# Patient Record
Sex: Female | Born: 1955 | Race: Black or African American | Hispanic: No | Marital: Married | Smoking: Current every day smoker
Health system: Southern US, Community
[De-identification: ages and names within clinical notes are randomized; demographics above are authoritative.]

## PROBLEM LIST (undated history)

## (undated) DIAGNOSIS — I729 Aneurysm of unspecified site: Secondary | ICD-10-CM

## (undated) DIAGNOSIS — I10 Essential (primary) hypertension: Secondary | ICD-10-CM

## (undated) DIAGNOSIS — R569 Unspecified convulsions: Secondary | ICD-10-CM

## (undated) DIAGNOSIS — I2510 Atherosclerotic heart disease of native coronary artery without angina pectoris: Secondary | ICD-10-CM

## (undated) DIAGNOSIS — J449 Chronic obstructive pulmonary disease, unspecified: Secondary | ICD-10-CM

## (undated) DIAGNOSIS — K5790 Diverticulosis of intestine, part unspecified, without perforation or abscess without bleeding: Secondary | ICD-10-CM

## (undated) DIAGNOSIS — E119 Type 2 diabetes mellitus without complications: Secondary | ICD-10-CM

## (undated) HISTORY — PX: LUMBAR FUSION: SHX111

## (undated) HISTORY — PX: OTHER SURGICAL HISTORY: SHX169

## (undated) HISTORY — DX: Type 2 diabetes mellitus without complications: E11.9

## (undated) HISTORY — PX: TUMOR EXCISION: SHX421

## (undated) HISTORY — DX: Unspecified convulsions: R56.9

## (undated) HISTORY — DX: Diverticulosis of intestine, part unspecified, without perforation or abscess without bleeding: K57.90

## (undated) HISTORY — DX: Aneurysm of unspecified site: I72.9

---

## 2016-12-02 ENCOUNTER — Emergency Department (HOSPITAL_BASED_OUTPATIENT_CLINIC_OR_DEPARTMENT_OTHER)
Admission: EM | Admit: 2016-12-02 | Discharge: 2016-12-02 | Disposition: A | Discharge status: Home / Self Care | Payer: Self-pay | Attending: Emergency Medicine | Admitting: Emergency Medicine

## 2016-12-02 ENCOUNTER — Emergency Department (HOSPITAL_BASED_OUTPATIENT_CLINIC_OR_DEPARTMENT_OTHER): Payer: Self-pay

## 2016-12-02 ENCOUNTER — Encounter (HOSPITAL_BASED_OUTPATIENT_CLINIC_OR_DEPARTMENT_OTHER): Payer: Self-pay

## 2016-12-02 DIAGNOSIS — J449 Chronic obstructive pulmonary disease, unspecified: Secondary | ICD-10-CM | POA: Insufficient documentation

## 2016-12-02 DIAGNOSIS — I2510 Atherosclerotic heart disease of native coronary artery without angina pectoris: Secondary | ICD-10-CM | POA: Insufficient documentation

## 2016-12-02 DIAGNOSIS — R05 Cough: Secondary | ICD-10-CM | POA: Insufficient documentation

## 2016-12-02 DIAGNOSIS — R0981 Nasal congestion: Secondary | ICD-10-CM | POA: Insufficient documentation

## 2016-12-02 DIAGNOSIS — J40 Bronchitis, not specified as acute or chronic: Secondary | ICD-10-CM | POA: Insufficient documentation

## 2016-12-02 HISTORY — DX: Chronic obstructive pulmonary disease, unspecified: J44.9

## 2016-12-02 LAB — CBC WITH DIFFERENTIAL/PLATELET
BASOS PCT: 0 %
Basophils Absolute: 0 10*3/uL (ref 0.0–0.1)
EOS ABS: 0.3 10*3/uL (ref 0.0–0.7)
Eosinophils Relative: 2 %
HCT: 44.3 % (ref 36.0–46.0)
HEMOGLOBIN: 15.3 g/dL — AB (ref 12.0–15.0)
Lymphocytes Relative: 22 %
Lymphs Abs: 3.1 10*3/uL (ref 0.7–4.0)
MCH: 29.9 pg (ref 26.0–34.0)
MCHC: 34.5 g/dL (ref 30.0–36.0)
MCV: 86.7 fL (ref 78.0–100.0)
Monocytes Absolute: 0.6 10*3/uL (ref 0.1–1.0)
Monocytes Relative: 4 %
NEUTROS PCT: 72 %
Neutro Abs: 10.2 10*3/uL — ABNORMAL HIGH (ref 1.7–7.7)
PLATELETS: 196 10*3/uL (ref 150–400)
RBC: 5.11 MIL/uL (ref 3.87–5.11)
RDW: 16.1 % — ABNORMAL HIGH (ref 11.5–15.5)
WBC: 14.2 10*3/uL — ABNORMAL HIGH (ref 4.0–10.5)

## 2016-12-02 LAB — COMPREHENSIVE METABOLIC PANEL
ALK PHOS: 112 U/L (ref 38–126)
ALT: 13 U/L — AB (ref 14–54)
AST: 19 U/L (ref 15–41)
Albumin: 4.2 g/dL (ref 3.5–5.0)
Anion gap: 7 (ref 5–15)
BILIRUBIN TOTAL: 0.8 mg/dL (ref 0.3–1.2)
BUN: 9 mg/dL (ref 6–20)
CO2: 23 mmol/L (ref 22–32)
Calcium: 9.3 mg/dL (ref 8.9–10.3)
Chloride: 107 mmol/L (ref 101–111)
Creatinine, Ser: 0.89 mg/dL (ref 0.44–1.00)
GLUCOSE: 100 mg/dL — AB (ref 65–99)
Potassium: 3.8 mmol/L (ref 3.5–5.1)
Sodium: 137 mmol/L (ref 135–145)
Total Protein: 8 g/dL (ref 6.5–8.1)

## 2016-12-02 LAB — BRAIN NATRIURETIC PEPTIDE: B Natriuretic Peptide: 54.6 pg/mL (ref 0.0–100.0)

## 2016-12-02 LAB — TROPONIN I

## 2016-12-02 IMAGING — CR DG CHEST 2V
2 series · 2 of 2 positions shown · non-contrast
Comparison: None.

CLINICAL DATA: Cough with shortness of breath and wheezing. Chest
pain

EXAM:
CHEST  2 VIEW

[w chest pa]
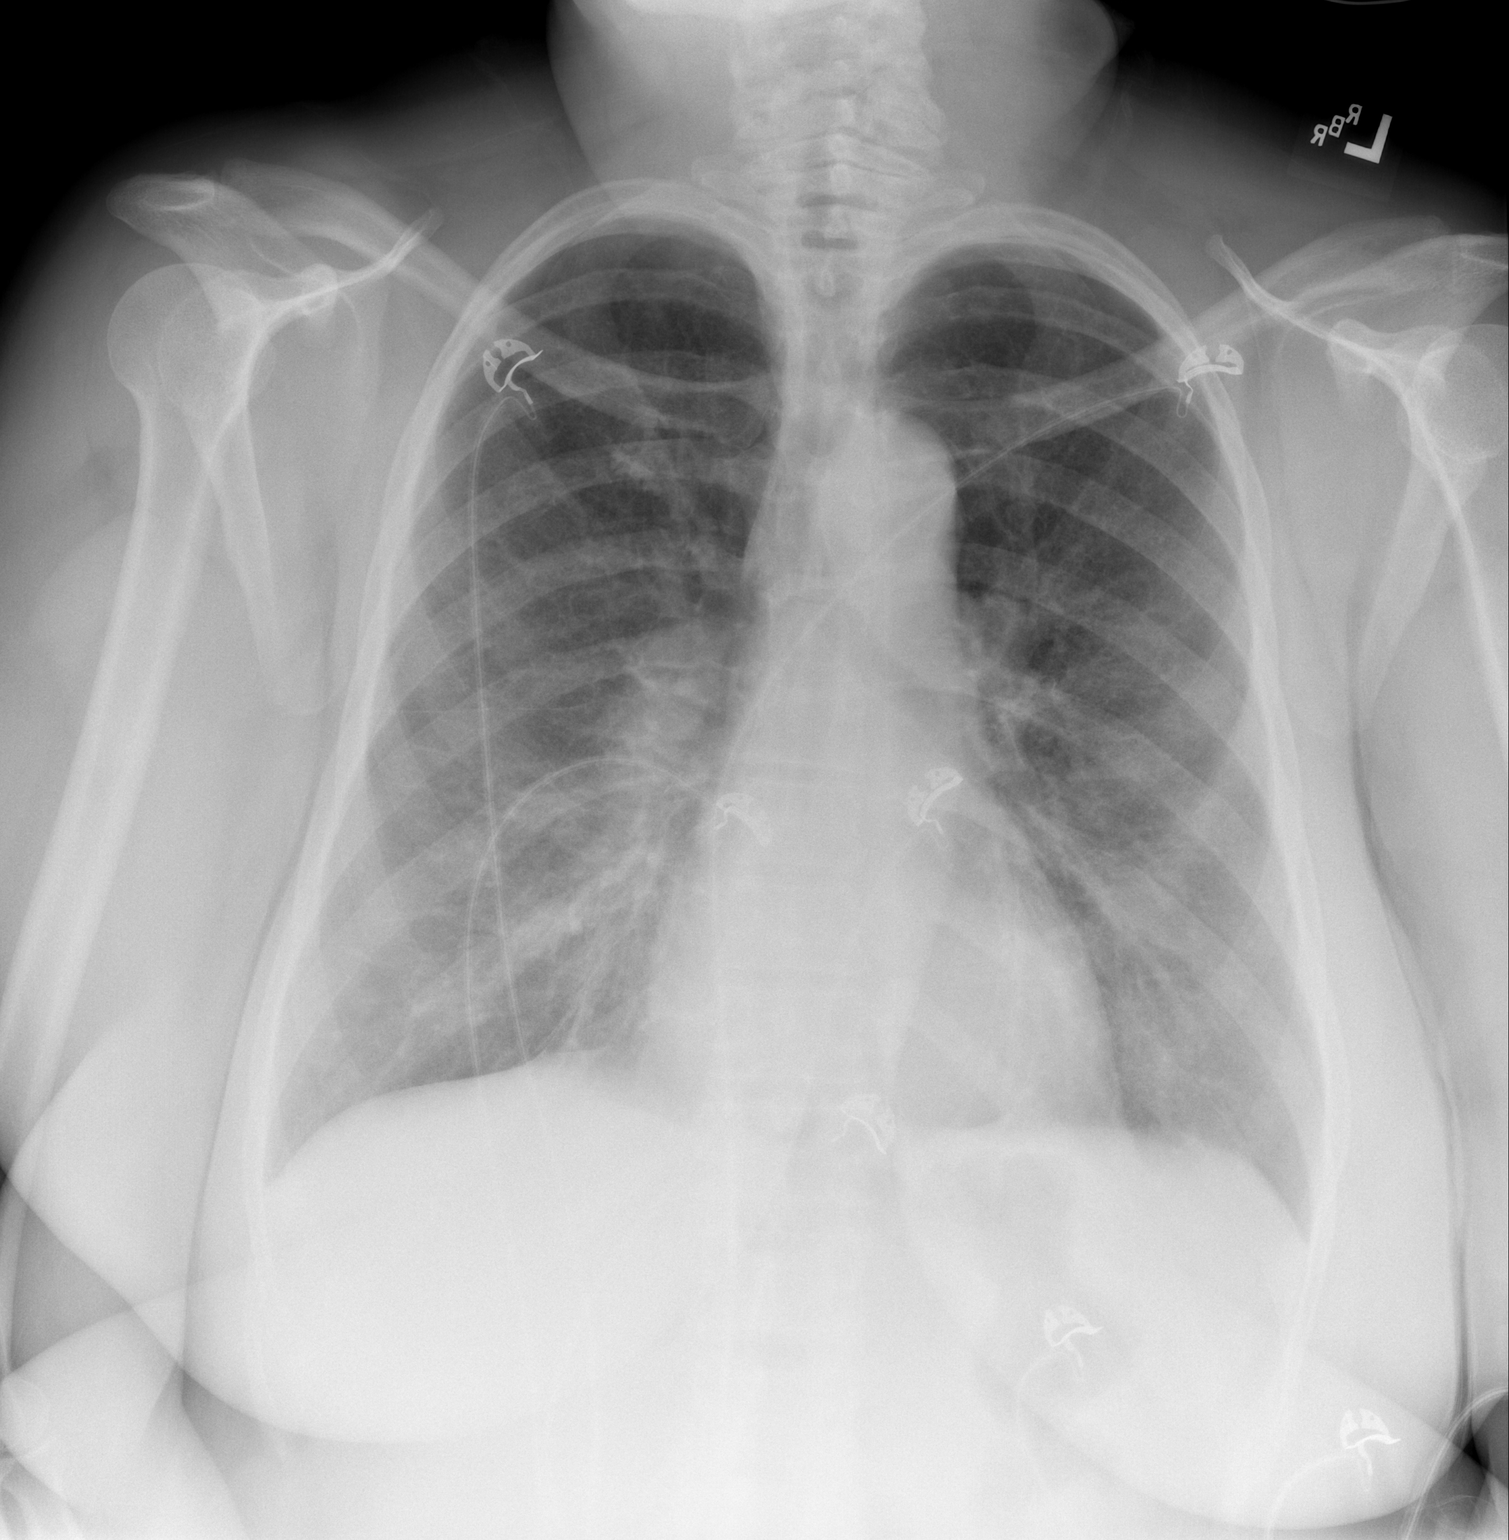

[w chest lat]
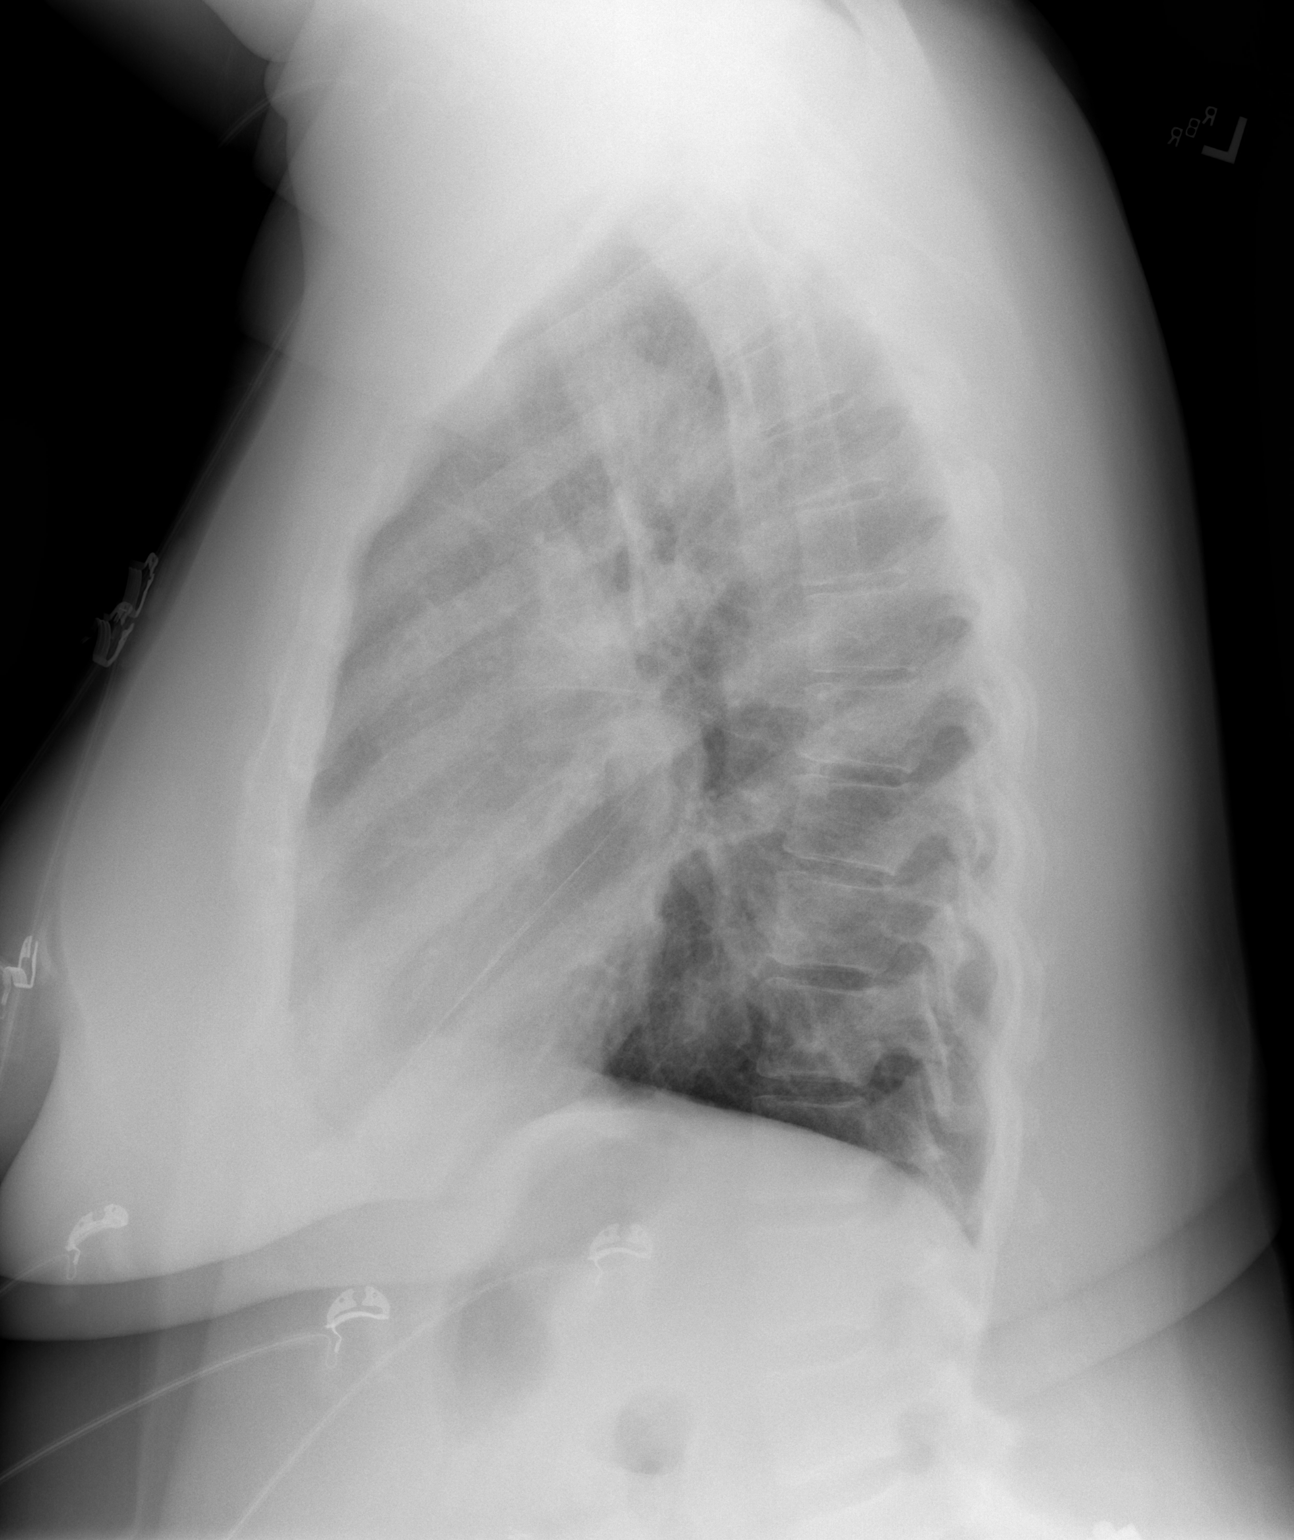

[2 of 2 positions shown; findings below may reference images not displayed]

FINDINGS: Interstitial coarsening that could be bronchitic. No Kerley B lines,
effusion, or pneumothorax. Normal heart size and aortic contours.
IMPRESSION: Possible bronchitic markings.  Negative for pneumonia or edema.

## 2016-12-02 MED ORDER — SODIUM CHLORIDE 0.9 % IV BOLUS (SEPSIS)
1000.0000 mL | Freq: Once | INTRAVENOUS | Status: AC
  Administered 2016-12-02: 1000 mL via INTRAVENOUS

## 2016-12-02 MED ORDER — METHYLPREDNISOLONE SODIUM SUCC 125 MG IJ SOLR
125.0000 mg | Freq: Once | INTRAMUSCULAR | Status: AC
  Administered 2016-12-02: 125 mg via INTRAVENOUS
  Filled 2016-12-02: qty 2

## 2016-12-02 MED ORDER — ALBUTEROL (5 MG/ML) CONTINUOUS INHALATION SOLN
10.0000 mg/h | INHALATION_SOLUTION | Freq: Once | RESPIRATORY_TRACT | Status: AC
  Administered 2016-12-02: 10 mg/h via RESPIRATORY_TRACT
  Filled 2016-12-02: qty 20

## 2016-12-02 MED ORDER — ALBUTEROL SULFATE (2.5 MG/3ML) 0.083% IN NEBU
2.5000 mg | INHALATION_SOLUTION | Freq: Once | RESPIRATORY_TRACT | Status: AC
  Administered 2016-12-02: 2.5 mg via RESPIRATORY_TRACT
  Filled 2016-12-02: qty 3

## 2016-12-02 MED ORDER — TIOTROPIUM BROMIDE MONOHYDRATE 18 MCG IN CAPS: 18.0000 ug | ORAL_CAPSULE | Freq: Every day | RESPIRATORY_TRACT | 0 refills | Status: DC

## 2016-12-02 MED ORDER — PREDNISONE 50 MG PO TABS: 50.0000 mg | ORAL_TABLET | Freq: Every day | ORAL | 0 refills | Status: DC

## 2016-12-02 MED ORDER — IPRATROPIUM-ALBUTEROL 0.5-2.5 (3) MG/3ML IN SOLN
3.0000 mL | Freq: Once | RESPIRATORY_TRACT | Status: AC
  Administered 2016-12-02: 3 mL via RESPIRATORY_TRACT
  Filled 2016-12-02: qty 3

## 2016-12-02 MED ORDER — ALBUTEROL SULFATE HFA 108 (90 BASE) MCG/ACT IN AERS: 2.0000 | INHALATION_SPRAY | RESPIRATORY_TRACT | 0 refills | Status: AC | PRN

## 2016-12-02 NOTE — ED Notes (Signed)
Ambulated in hall on room air HR 101-108, SpO2 91-95% RR 22.  Patient has walker at home but not using. BBS with scatter rhonchi.

## 2016-12-02 NOTE — ED Notes (Signed)
Patient ambulated to bathroom, spot check pox 88-90%, RR 24, HR 122 after 1 hour CAT neb. RN aware.

## 2016-12-02 NOTE — ED Triage Notes (Signed)
Pt reports SHOB since Saturday; has hx of COPD. Pt in tripod position and coughing. Pt's initial pulse ox 94-95% on room air. Respiratory at bedside.

## 2016-12-02 NOTE — Discharge Instructions (Signed)
Return here as needed. Follow up with a primary doctor. °

## 2016-12-02 NOTE — ED Provider Notes (Signed)
[facility]   CSN: [id number] Arrival date & time: 12/02/16  1402     History   Chief Complaint Chief Complaint  Patient presents with  . Shortness of Breath    HPI SELAH KLANG is a 61 y.o. female.  HPI ADAJAH COCKING is a 61 y.o. female with history of COPD, coronary artery disease, presents to emergency department complaining of cough, congestion, shortness of breath. Patient states her symptoms began 3 days ago after she ate some vegetables that she believes were not washed. She states "pesticide from all these vegetables cause me to have an asthma attack." Patient states she also planted some flowers same day and states that the dirt exacerbated her symptoms. She states that she has been feeling worse and worse every day since then. She reports productive cough, shortness of breath, chest tightness with coughing. She reports subjective fever, chills, nasal congestion. She states that she took Tylenol for her fever and chills at home, last dose this morning. Patient states that she is currently out of her medications which includes her blood pressure medications, her blood thinners, and her COPD medications and inhalers. She states she has not been to the doctor in about a year. She states she has not had her medicines for about 6 months. She states that any exertion making her symptoms worse, nothing makes it better.  Past Medical History:  Diagnosis Date  . COPD (chronic obstructive pulmonary disease) (HCC)     There are no active problems to display for this patient.   No past surgical history on file.  OB History    No data available       Home Medications    Prior to Admission medications   Not on File    Family History No family history on file.  Social History Social History  Substance Use Topics  . Smoking status: Not on file  . Smokeless tobacco: Not on file  . Alcohol use Not on file     Allergies   Aspirin;  Penicillins; and Sulfa antibiotics   Review of Systems Review of Systems  Constitutional: Positive for chills and fever.  HENT: Positive for congestion.   Respiratory: Positive for cough, chest tightness and shortness of breath.   Cardiovascular: Positive for chest pain and leg swelling. Negative for palpitations.  Gastrointestinal: Negative for abdominal pain, diarrhea, nausea and vomiting.  Genitourinary: Negative for dysuria, flank pain, pelvic pain, vaginal bleeding, vaginal discharge and vaginal pain.  Musculoskeletal: Negative for arthralgias, myalgias, neck pain and neck stiffness.  Skin: Negative for rash.  Neurological: Negative for dizziness, weakness and headaches.  All other systems reviewed and are negative.    Physical Exam Updated Vital Signs BP (!) 211/118 (BP Location: Left Arm)   Pulse 91   Temp 98.2 F (36.8 C) (Oral)   Resp (!) 28   SpO2 96%   Physical Exam  Constitutional: She appears well-developed and well-nourished. No distress.  HENT:  Head: Normocephalic.  Eyes: Conjunctivae are normal.  Neck: Neck supple.  Cardiovascular: Normal rate, regular rhythm and normal heart sounds.   Pulmonary/Chest: Effort normal. No respiratory distress. She has wheezes. She has rales.  Bilateral wheezes and rails  Abdominal: Soft. Bowel sounds are normal. She exhibits no distension. There is no tenderness. There is no rebound.  Musculoskeletal: She exhibits no edema.  Neurological: She is alert.  Skin: Skin is warm and dry.  Psychiatric: She has a normal mood and affect. Her behavior  is normal.  Nursing note and vitals reviewed.    ED Treatments / Results  Labs (all labs ordered are listed, but only abnormal results are displayed) Labs Reviewed  CBC WITH DIFFERENTIAL/PLATELET  COMPREHENSIVE METABOLIC PANEL  TROPONIN I  BRAIN NATRIURETIC PEPTIDE    EKG  EKG Interpretation None       Radiology Dg Chest 2 View  Result Date: 12/02/2016 CLINICAL DATA:   Cough with shortness of breath and wheezing. Chest pain EXAM: CHEST  2 VIEW COMPARISON:  None. FINDINGS: Interstitial coarsening that could be bronchitic. No Kerley B lines, effusion, or pneumothorax. Normal heart size and aortic contours. IMPRESSION: Possible bronchitic markings.  Negative for pneumonia or edema. Electronically Signed   By: Marnee Spring M.D.   On: 12/02/2016 15:12    Procedures Procedures (including critical care time)  Medications Ordered in ED Medications  ipratropium-albuterol (DUONEB) 0.5-2.5 (3) MG/3ML nebulizer solution 3 mL (not administered)  albuterol (PROVENTIL) (2.5 MG/3ML) 0.083% nebulizer solution 2.5 mg (not administered)  methylPREDNISolone sodium succinate (SOLU-MEDROL) 125 mg/2 mL injection 125 mg (not administered)     Initial Impression / Assessment and Plan / ED Course  I have reviewed the triage vital signs and the nursing notes.  Pertinent labs & imaging results that were available during my care of the patient were reviewed by me and considered in my medical decision making (see chart for details).     Patient seen and examined. Patient with COPD exacerbation, cough, shortness of breath. She does have wheezing and rails on exam. She has been off of her blood pressure and COPD medications for greater than 6 months. Her blood pressure is elevated, 211/118. She states it is "because I am in pain." We will give her breathing treatments, steroids, check labs, EKG, chest x-ray. Will monitor closely.  3:19 PM CXR consistent with bronchitic changes. No pneumonia. No improvement with duoneb, still wheezing. Will order hour long neb  Singed out pending blood work and recheck after hour long neb. If improving and does not desat with ambulation, OK for dc home. Signed out to [employer].     Final Clinical Impressions(s) / ED Diagnoses   Final diagnoses:  None    New Prescriptions New Prescriptions   No medications on file     Jaynie Crumble, Cordelia Poche 12/03/16 1539    Benjiman Core, MD 12/03/16 1640

## 2019-11-30 ENCOUNTER — Emergency Department (HOSPITAL_COMMUNITY): Payer: PRIVATE HEALTH INSURANCE

## 2019-11-30 ENCOUNTER — Encounter (HOSPITAL_COMMUNITY): Payer: Self-pay | Admitting: Internal Medicine

## 2019-11-30 ENCOUNTER — Inpatient Hospital Stay (HOSPITAL_COMMUNITY)
Admission: EM | Admit: 2019-11-30 | Discharge: 2019-12-03 | DRG: 097 | Disposition: A | Discharge status: Home Health | Payer: PRIVATE HEALTH INSURANCE | Attending: Family Medicine | Admitting: Family Medicine

## 2019-11-30 DIAGNOSIS — I10 Essential (primary) hypertension: Secondary | ICD-10-CM | POA: Diagnosis present

## 2019-11-30 DIAGNOSIS — E1165 Type 2 diabetes mellitus with hyperglycemia: Secondary | ICD-10-CM | POA: Diagnosis present

## 2019-11-30 DIAGNOSIS — J449 Chronic obstructive pulmonary disease, unspecified: Secondary | ICD-10-CM | POA: Diagnosis present

## 2019-11-30 DIAGNOSIS — A419 Sepsis, unspecified organism: Secondary | ICD-10-CM | POA: Diagnosis present

## 2019-11-30 DIAGNOSIS — Z7902 Long term (current) use of antithrombotics/antiplatelets: Secondary | ICD-10-CM

## 2019-11-30 DIAGNOSIS — I2510 Atherosclerotic heart disease of native coronary artery without angina pectoris: Secondary | ICD-10-CM | POA: Diagnosis present

## 2019-11-30 DIAGNOSIS — R4182 Altered mental status, unspecified: Secondary | ICD-10-CM | POA: Diagnosis present

## 2019-11-30 DIAGNOSIS — G9341 Metabolic encephalopathy: Secondary | ICD-10-CM | POA: Diagnosis present

## 2019-11-30 DIAGNOSIS — Z955 Presence of coronary angioplasty implant and graft: Secondary | ICD-10-CM | POA: Diagnosis not present

## 2019-11-30 DIAGNOSIS — F17210 Nicotine dependence, cigarettes, uncomplicated: Secondary | ICD-10-CM | POA: Diagnosis present

## 2019-11-30 DIAGNOSIS — G8191 Hemiplegia, unspecified affecting right dominant side: Secondary | ICD-10-CM | POA: Diagnosis present

## 2019-11-30 DIAGNOSIS — Z20822 Contact with and (suspected) exposure to covid-19: Secondary | ICD-10-CM | POA: Diagnosis present

## 2019-11-30 DIAGNOSIS — R569 Unspecified convulsions: Secondary | ICD-10-CM | POA: Diagnosis present

## 2019-11-30 DIAGNOSIS — G40201 Localization-related (focal) (partial) symptomatic epilepsy and epileptic syndromes with complex partial seizures, not intractable, with status epilepticus: Secondary | ICD-10-CM

## 2019-11-30 DIAGNOSIS — A86 Unspecified viral encephalitis: Secondary | ICD-10-CM | POA: Diagnosis present

## 2019-11-30 DIAGNOSIS — E785 Hyperlipidemia, unspecified: Secondary | ICD-10-CM | POA: Diagnosis present

## 2019-11-30 DIAGNOSIS — G9340 Encephalopathy, unspecified: Secondary | ICD-10-CM

## 2019-11-30 DIAGNOSIS — R6520 Severe sepsis without septic shock: Secondary | ICD-10-CM

## 2019-11-30 HISTORY — DX: Essential (primary) hypertension: I10

## 2019-11-30 HISTORY — DX: Atherosclerotic heart disease of native coronary artery without angina pectoris: I25.10

## 2019-11-30 LAB — CBC
HCT: 49.5 % — ABNORMAL HIGH (ref 36.0–46.0)
Hemoglobin: 16.4 g/dL — ABNORMAL HIGH (ref 12.0–15.0)
MCH: 29.3 pg (ref 26.0–34.0)
MCHC: 33.1 g/dL (ref 30.0–36.0)
MCV: 88.4 fL (ref 80.0–100.0)
Platelets: 328 10*3/uL (ref 150–400)
RBC: 5.6 MIL/uL — ABNORMAL HIGH (ref 3.87–5.11)
RDW: 14.2 % (ref 11.5–15.5)
WBC: 16.6 10*3/uL — ABNORMAL HIGH (ref 4.0–10.5)
nRBC: 0 % (ref 0.0–0.2)

## 2019-11-30 LAB — CSF CELL COUNT WITH DIFFERENTIAL
Eosinophils, CSF: 0 % (ref 0–1)
Eosinophils, CSF: 0 % (ref 0–1)
Lymphs, CSF: 0 % — ABNORMAL LOW (ref 40–80)
Lymphs, CSF: 1 % — ABNORMAL LOW (ref 40–80)
Monocyte-Macrophage-Spinal Fluid: 11 % — ABNORMAL LOW (ref 15–45)
Monocyte-Macrophage-Spinal Fluid: 6 % — ABNORMAL LOW (ref 15–45)
RBC Count, CSF: 155 /mm3 — ABNORMAL HIGH
RBC Count, CSF: 6 /mm3 — ABNORMAL HIGH
Segmented Neutrophils-CSF: 89 % — ABNORMAL HIGH (ref 0–6)
Segmented Neutrophils-CSF: 93 % — ABNORMAL HIGH (ref 0–6)
Tube #: 1
Tube #: 4
WBC, CSF: 24 /mm3 (ref 0–5)
WBC, CSF: 26 /mm3 (ref 0–5)

## 2019-11-30 LAB — I-STAT CHEM 8, ED
BUN: 12 mg/dL (ref 8–23)
Calcium, Ion: 1.13 mmol/L — ABNORMAL LOW (ref 1.15–1.40)
Chloride: 108 mmol/L (ref 98–111)
Creatinine, Ser: 0.6 mg/dL (ref 0.44–1.00)
Glucose, Bld: 340 mg/dL — ABNORMAL HIGH (ref 70–99)
HCT: 50 % — ABNORMAL HIGH (ref 36.0–46.0)
Hemoglobin: 17 g/dL — ABNORMAL HIGH (ref 12.0–15.0)
Potassium: 3.6 mmol/L (ref 3.5–5.1)
Sodium: 140 mmol/L (ref 135–145)
TCO2: 17 mmol/L — ABNORMAL LOW (ref 22–32)

## 2019-11-30 LAB — COMPREHENSIVE METABOLIC PANEL
ALT: 19 U/L (ref 0–44)
AST: 18 U/L (ref 15–41)
Albumin: 3.7 g/dL (ref 3.5–5.0)
Alkaline Phosphatase: 87 U/L (ref 38–126)
Anion gap: 17 — ABNORMAL HIGH (ref 5–15)
BUN: 12 mg/dL (ref 8–23)
CO2: 18 mmol/L — ABNORMAL LOW (ref 22–32)
Calcium: 9.9 mg/dL (ref 8.9–10.3)
Chloride: 104 mmol/L (ref 98–111)
Creatinine, Ser: 1.01 mg/dL — ABNORMAL HIGH (ref 0.44–1.00)
GFR calc Af Amer: >60 mL/min (ref >60)
GFR calc non Af Amer: 59 mL/min — ABNORMAL LOW (ref >60)
Glucose, Bld: 337 mg/dL — ABNORMAL HIGH (ref 70–99)
Potassium: 3.7 mmol/L (ref 3.5–5.1)
Sodium: 139 mmol/L (ref 135–145)
Total Bilirubin: 1.2 mg/dL (ref 0.3–1.2)
Total Protein: 7.5 g/dL (ref 6.5–8.1)

## 2019-11-30 LAB — PROTIME-INR
INR: 1.1 (ref 0.8–1.2)
Prothrombin Time: 13.7 seconds (ref 11.4–15.2)

## 2019-11-30 LAB — LACTIC ACID, PLASMA
Lactic Acid, Venous: 2.3 mmol/L (ref 0.5–1.9)
Lactic Acid, Venous: 1.9 mmol/L (ref 0.5–1.9)

## 2019-11-30 LAB — GLUCOSE, CSF: Glucose, CSF: 202 mg/dL — ABNORMAL HIGH (ref 40–70)

## 2019-11-30 LAB — RESPIRATORY PANEL BY RT PCR (FLU A&B, COVID)
Influenza A by PCR: NEGATIVE
Influenza B by PCR: NEGATIVE
SARS Coronavirus 2 by RT PCR: NEGATIVE

## 2019-11-30 LAB — DIFFERENTIAL
Abs Immature Granulocytes: 0.1 10*3/uL — ABNORMAL HIGH (ref 0.00–0.07)
Basophils Absolute: 0.1 10*3/uL (ref 0.0–0.1)
Basophils Relative: 0 %
Eosinophils Absolute: 0 10*3/uL (ref 0.0–0.5)
Eosinophils Relative: 0 %
Immature Granulocytes: 1 %
Lymphocytes Relative: 15 %
Lymphs Abs: 2.5 10*3/uL (ref 0.7–4.0)
Monocytes Absolute: 1.2 10*3/uL — ABNORMAL HIGH (ref 0.1–1.0)
Monocytes Relative: 7 %
Neutro Abs: 12.8 10*3/uL — ABNORMAL HIGH (ref 1.7–7.7)
Neutrophils Relative %: 77 %

## 2019-11-30 LAB — APTT: aPTT: 25 seconds (ref 24–36)

## 2019-11-30 LAB — PROTEIN, CSF: Total  Protein, CSF: 41 mg/dL (ref 15–45)

## 2019-11-30 LAB — CBG MONITORING, ED: Glucose-Capillary: 373 mg/dL — ABNORMAL HIGH (ref 70–99)

## 2019-11-30 LAB — PROCALCITONIN: Procalcitonin: <0.1 ng/mL

## 2019-11-30 IMAGING — CT CT HEAD CODE STROKE
4 series · 17 of 47 positions shown, 19 images · non-contrast
Comparison: None.

CLINICAL DATA: Code stroke.  Acute neuro deficit.  Left-sided gaze.

EXAM:
CT HEAD WITHOUT CONTRAST
TECHNIQUE: Contiguous axial images were obtained from the base of the skull
through the vertex without intravenous contrast.

[Series 3: head wo · axial · 0.44mm/px · z∈[+1328,+1452]mm · 7 of 35 slices shown, 9 images]
[im 5/35  brain]
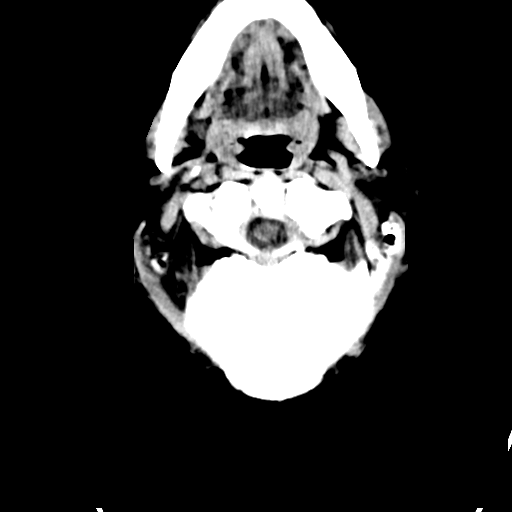
[im 5/35  bone]
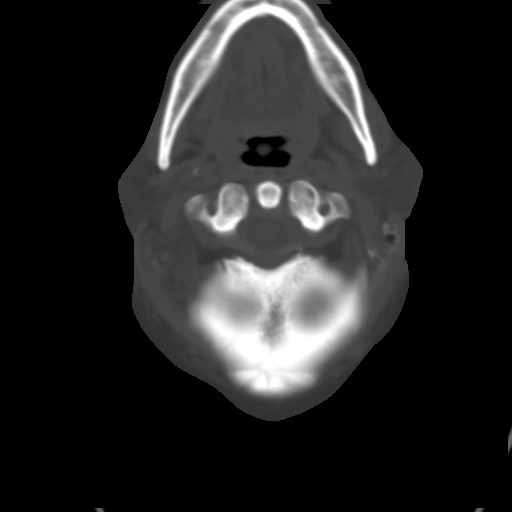
[im 9/35  brain]
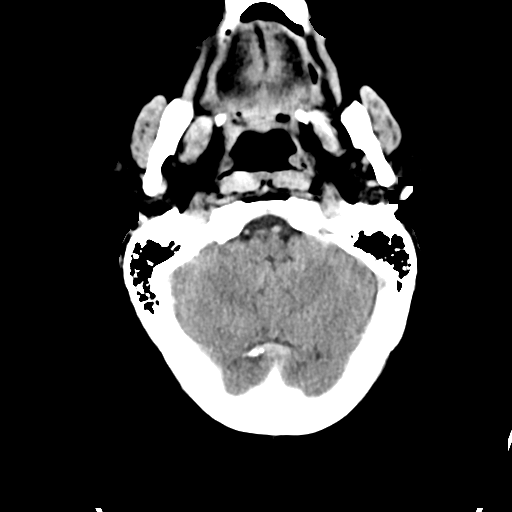
[im 13/35  brain]
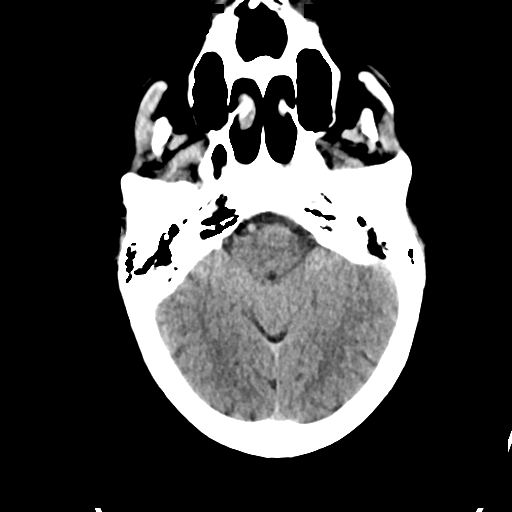
[im 18/35  brain]
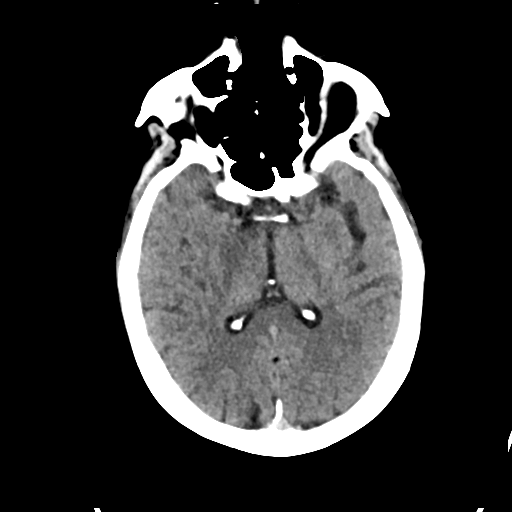
[im 22/35  brain]
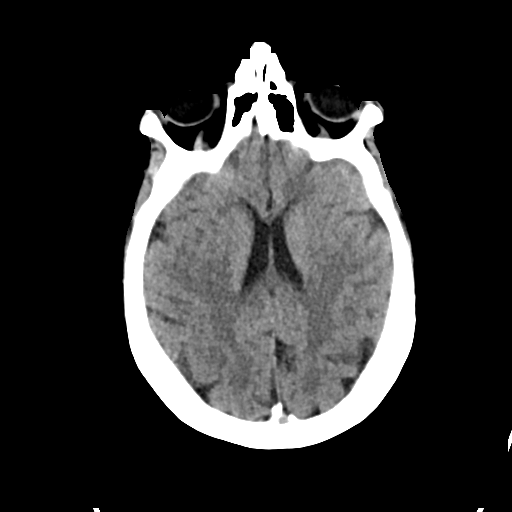
[im 22/35  bone]
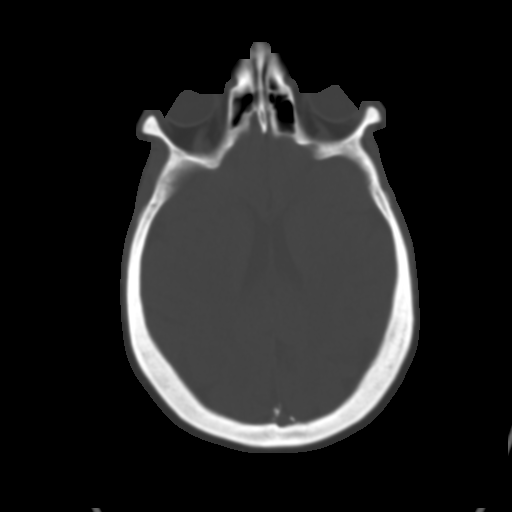
[im 26/35  brain]
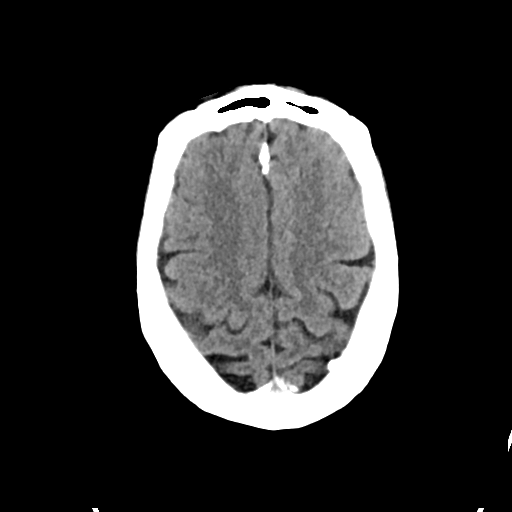
[im 30/35  brain]
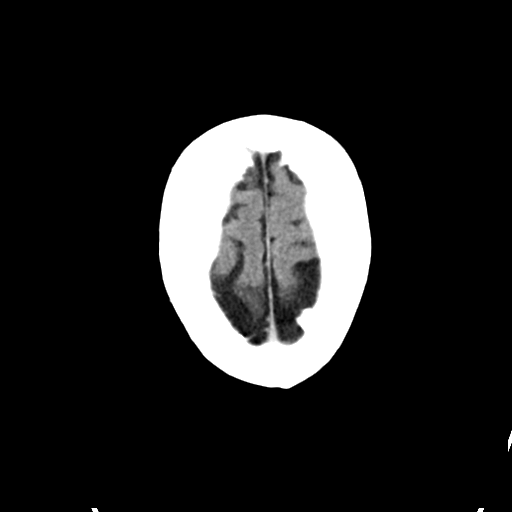

[Series 4: head bone · axial · 0.44mm/px · z∈[+1324,+1384]mm · 4 of 87 slices shown]
[im 9/87  bone]
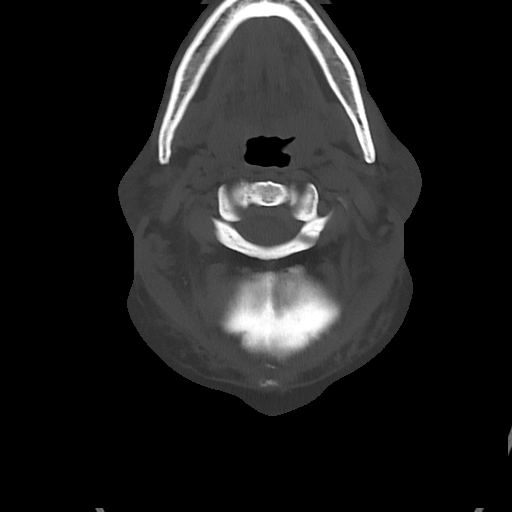
[im 18/87  bone]
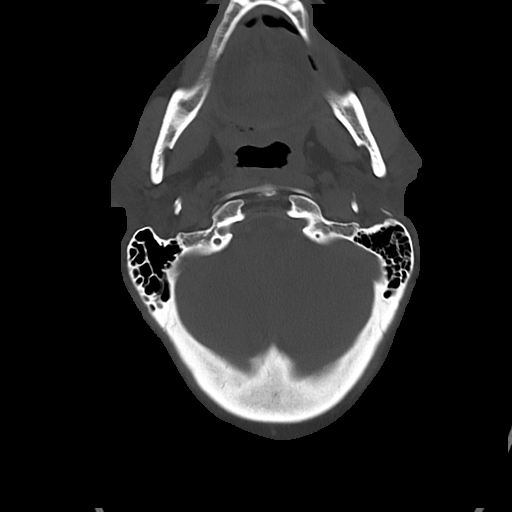
[im 26/87  bone]
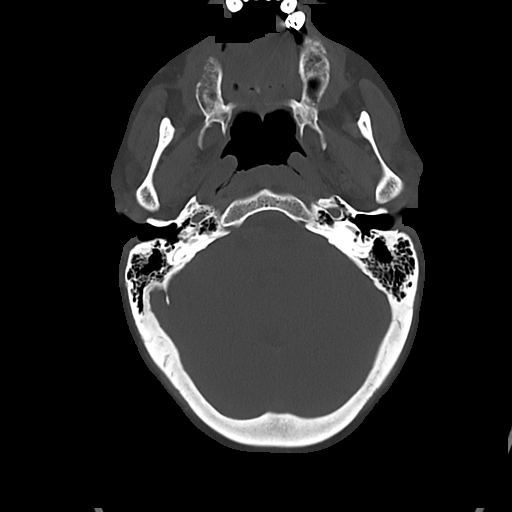
[im 39/87  bone]
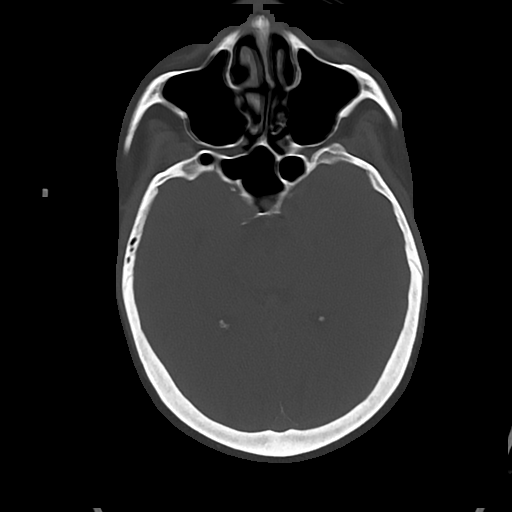

[Series 5: cor soft · coronal · 0.34mm/px · 3 of 69 slices shown]
[im 27/69  brain]
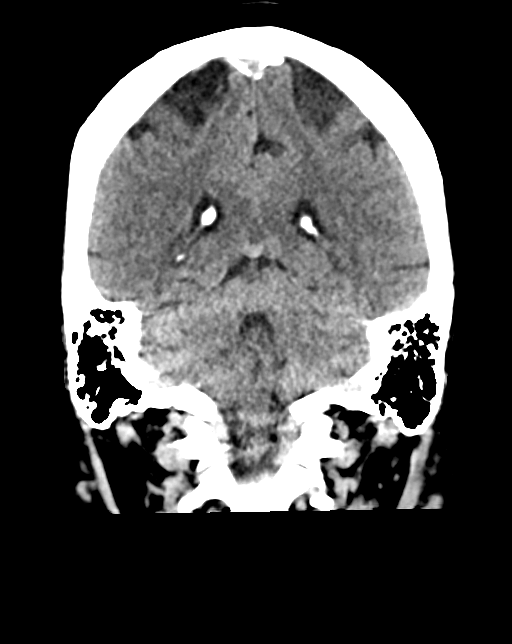
[im 32/69  brain]
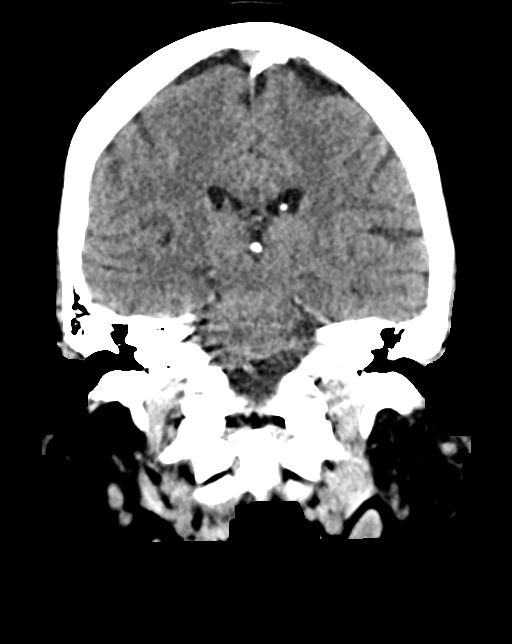
[im 37/69  brain]
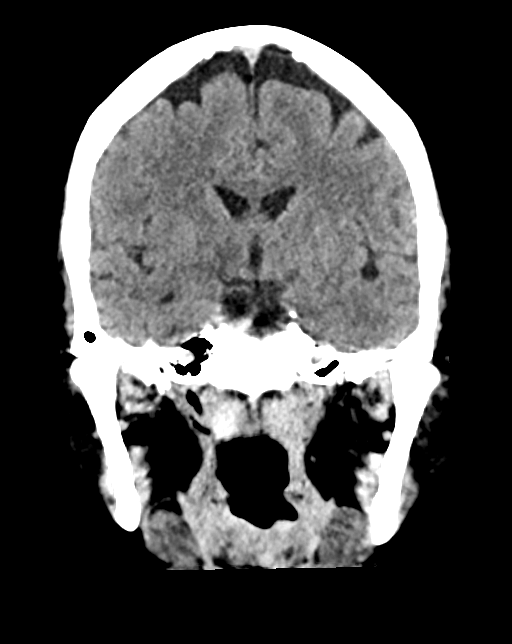

[Series 6: sag soft · sagittal · 0.41mm/px · 3 of 58 slices shown]
[im 20/58  brain]
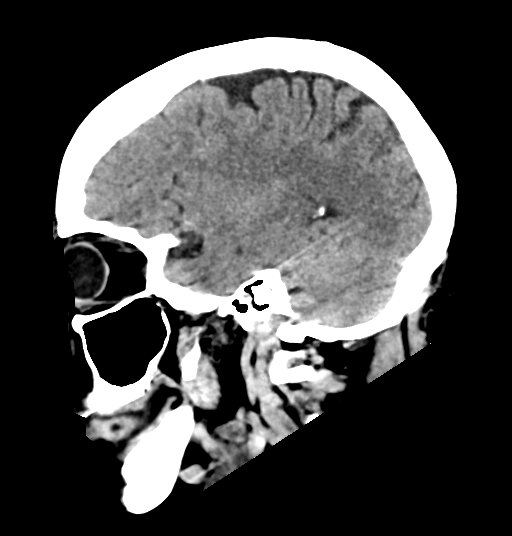
[im 29/58  brain]
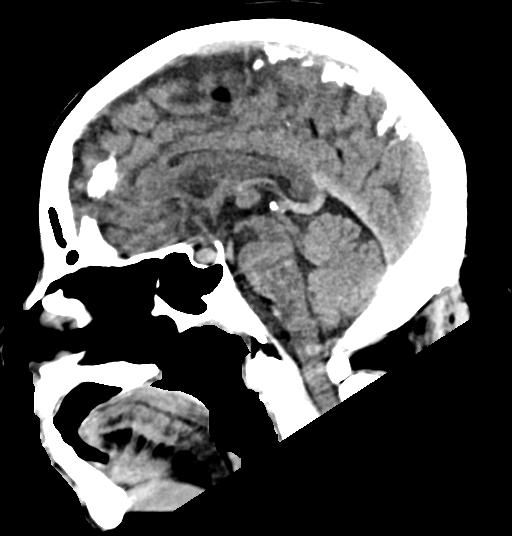
[im 39/58  brain]
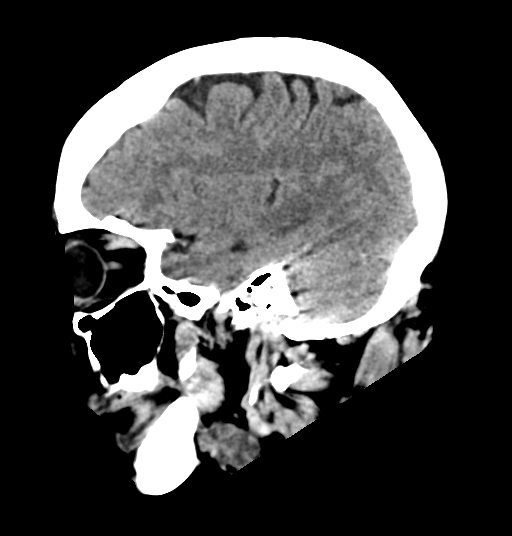

[17 of 47 positions shown; findings below may reference images not displayed]

FINDINGS: Brain: No evidence of acute infarction, hemorrhage, hydrocephalus,
extra-axial collection or mass lesion/mass effect.

Motion degraded study.

Vascular: Negative for hyperdense vessel.

Skull: Negative

Sinuses/Orbits: Paranasal sinuses clear.  Negative orbit.

Other: None

ASPECTS (Alberta Stroke Program Early CT Score)

- Ganglionic level infarction (caudate, lentiform nuclei, internal
capsule, insula, M1-M3 cortex): 7

- Supraganglionic infarction (M4-M6 cortex): 3

Total score (0-10 with 10 being normal): 10
IMPRESSION: 1. Negative CT head
2. ASPECTS is 10
3. Code stroke imaging results were communicated on [DATE] at
[DATE] to provider WILSON DE JESUS via secure text paging.

## 2019-11-30 IMAGING — RF DG FLUORO GUIDE SPINAL/SI JT INJ*L*
2 series · 2 of 2 positions shown · non-contrast
Comparison: none

CLINICAL DATA: 64-year-old female code stroke presentation, new
onset seizure, unexplained altered mental status. Found to be
febrile unsuccessful bedside lumbar puncture in the emergency
department.

[Series 1: fluoro_myelogram_singleshot_bb · 0.20mm/px · 1 of 1 slices shown]
[im 1/1]
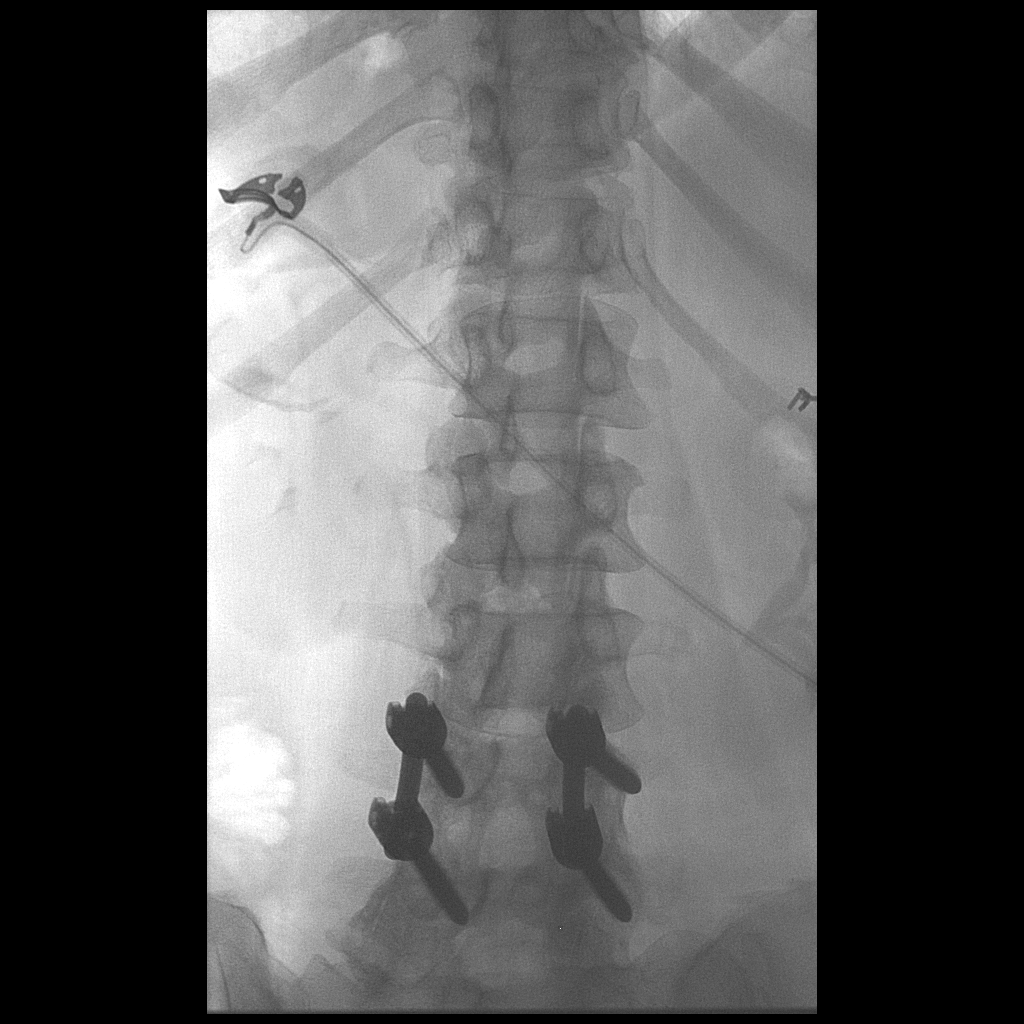

[Series 2: cp_standard · 0.17mm/px · 1 of 1 slices shown]
[im 1/1]
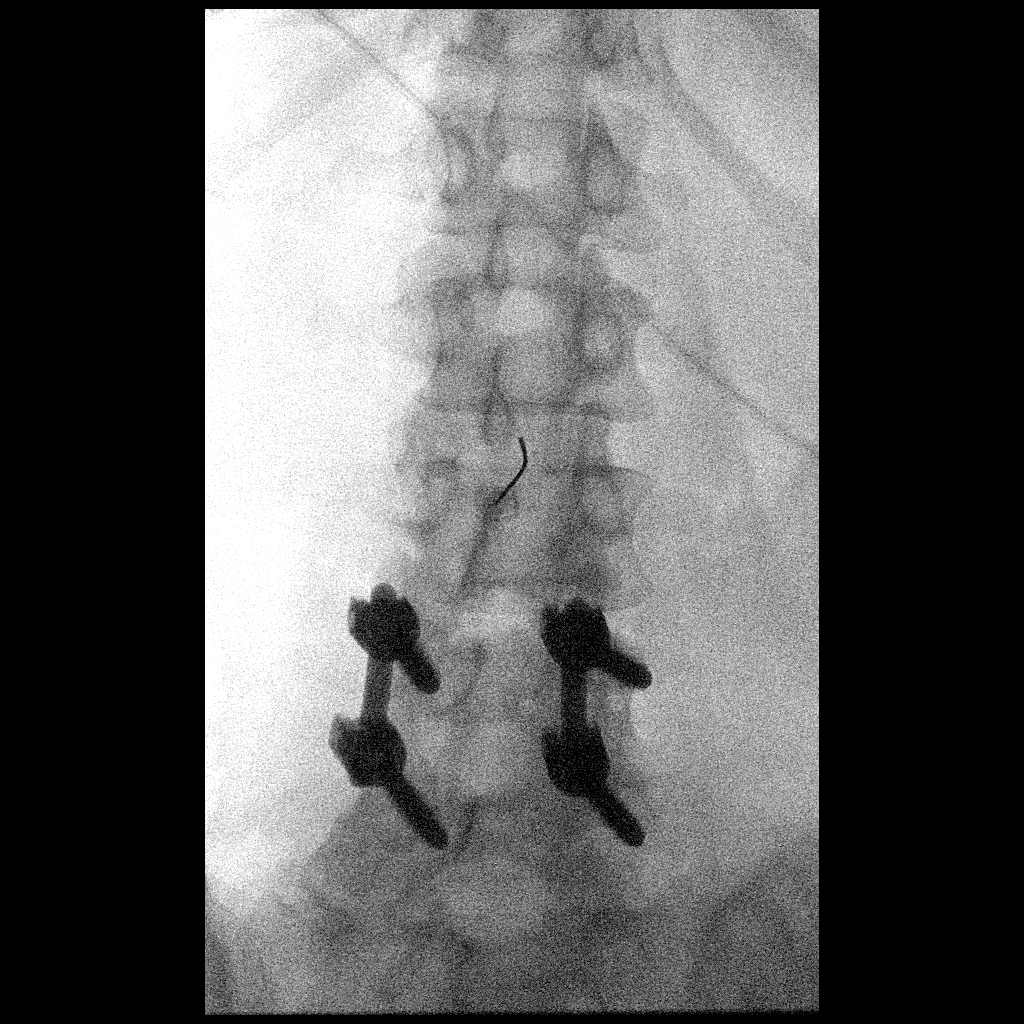

[2 of 2 positions shown; findings below may reference images not displayed]

EXAM:
DIAGNOSTIC LUMBAR PUNCTURE UNDER FLUOROSCOPIC GUIDANCE

FLUOROSCOPY TIME:  Fluoroscopy Time:  0 minutes 18 seconds

Radiation Exposure Index (if provided by the fluoroscopic device):

Number of Acquired Spot Images: 0

PROCEDURE:
Informed consent was obtained from the patient's family prior to the
procedure, including potential complications of headache, allergy,
and pain.

With the patient prone, the lower back was prepped with Betadine. 1%
Lidocaine was used for local anesthesia. Lumbar puncture was
performed at the L2-L3 level using right sub laminar technique with
a 5 inch, 20 gauge needle with return of clear, colorless CSF.

17.5 mL of CSF were obtained for laboratory studies.

The patient tolerated the procedure well and there were no apparent
complications.

Appropriate post procedural orders were placed on the chart. The
patient was returned to the ED in stable condition for continued
treatment.
IMPRESSION: Fluoroscopic guided lumbar puncture at L2-L3. 17.5 mL of clear,
colorless CSF obtained and sent to the lab for analysis.

## 2019-11-30 IMAGING — DX DG CHEST 1V PORT
1 series · 1 of 1 positions shown · non-contrast
Comparison: [DATE].

CLINICAL DATA: Altered mental status.

EXAM:
PORTABLE CHEST 1 VIEW

[chest ap]
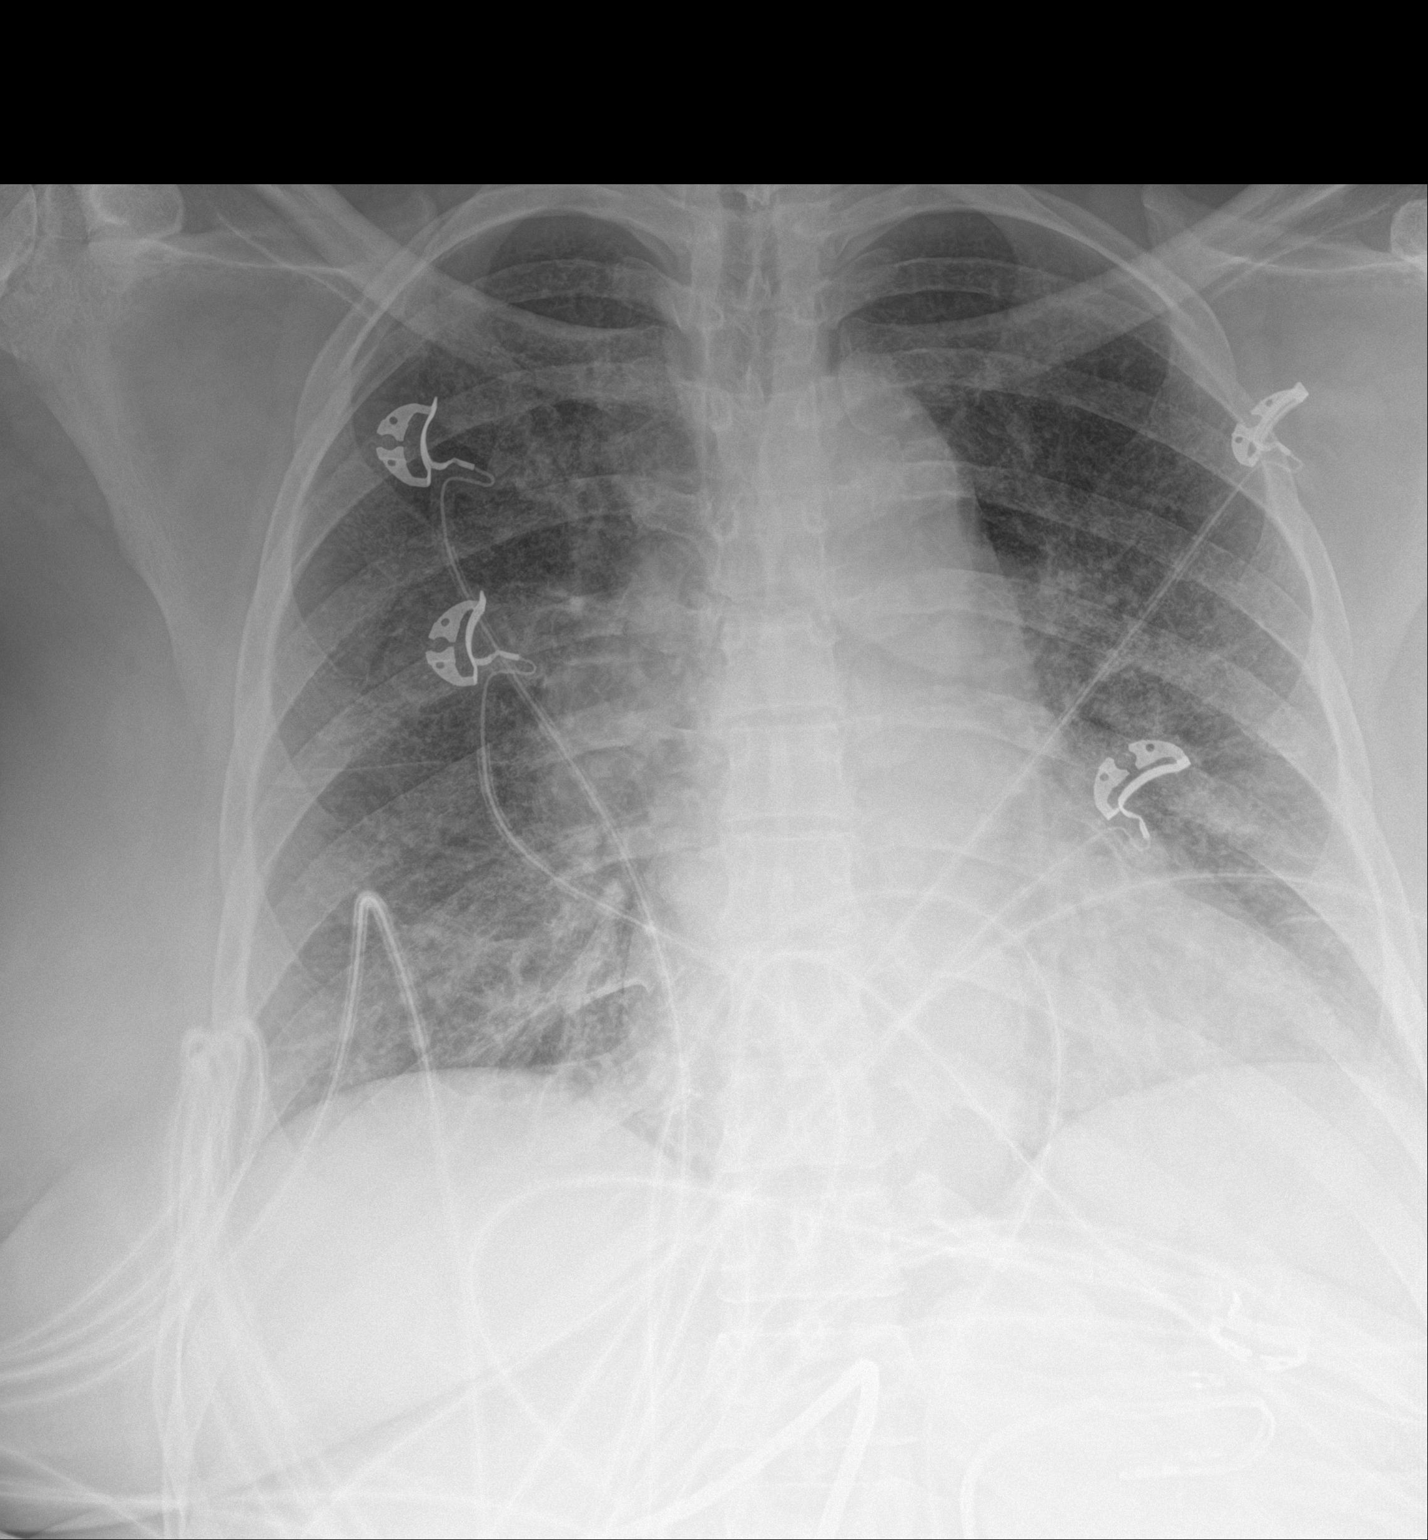

[1 of 1 positions shown; findings below may reference images not displayed]

FINDINGS: Stable cardiomediastinal silhouette. Bilateral interstitial
opacities are noted which may represent chronic scarring or
interstitial lung disease. Acute superimposed edema or inflammation
cannot be excluded. No pneumothorax or pleural effusion is noted.
Bony thorax is unremarkable.
IMPRESSION: Bilateral interstitial opacities are noted which may represent
chronic scarring or interstitial lung disease. Acute superimposed
edema or inflammation cannot be excluded.

## 2019-11-30 IMAGING — MR MR HEAD W/O CM
6 series · 45 of 48 positions shown · non-contrast
Comparison: CT head, CTP and CTA today.

CLINICAL DATA: 64-year-old female code stroke presentation, with
seizure in the CT suite.

CTA suspicious for left MCA JALON, but negative CT Perfusion and
evidence of underlying intracranial atherosclerosis.
EXAM:
MRI HEAD WITHOUT CONTRAST
TECHNIQUE: Multiplanar, multiecho pulse sequences of the brain and surrounding
structures were obtained without intravenous contrast.

[Series 3: DWI · axial · 3.0mm · 0.94mm/px · z∈[-7,+132]mm · 12 of 100 slices shown (1 of 2)]
[im 1/100]
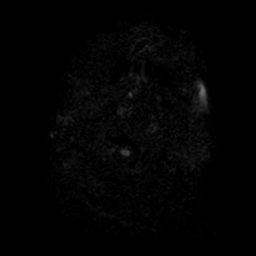
[im 8/100]
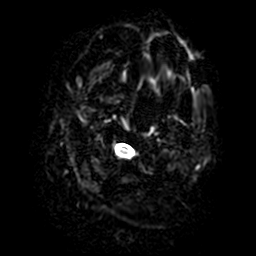
[im 15/100]
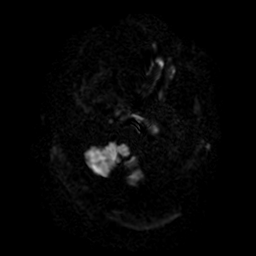
[im 22/100]
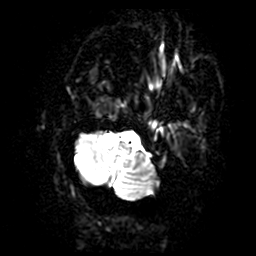
[im 29/100]
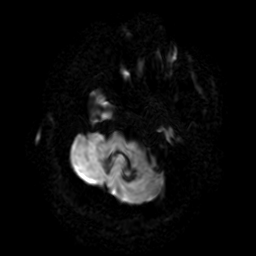
[im 36/100]
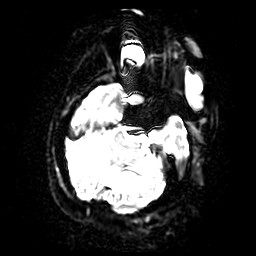
[im 43/100]
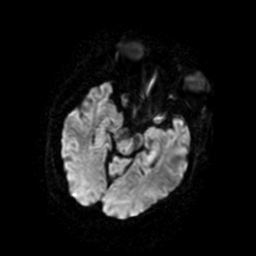
[im 50/100]
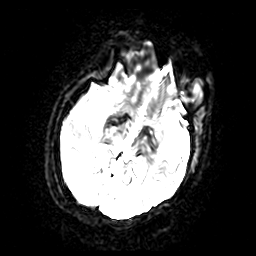
[im 57/100]
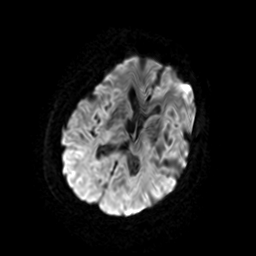
[im 71/100]
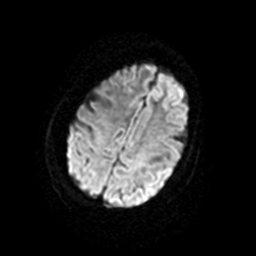
[im 85/100]
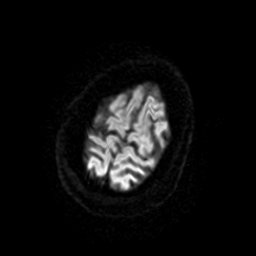
[im 100/100]
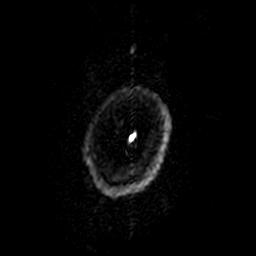

[Series 4: DWI · coronal · 4.0mm · 0.94mm/px · 11 of 72 slices shown (2 of 2)]
[im 1/72]
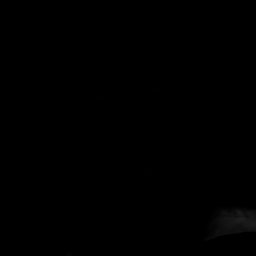
[im 8/72]
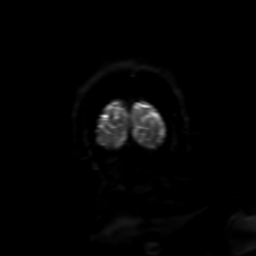
[im 15/72]
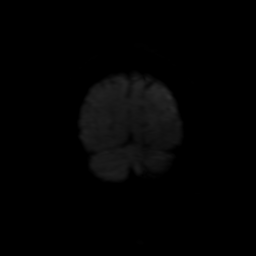
[im 22/72]
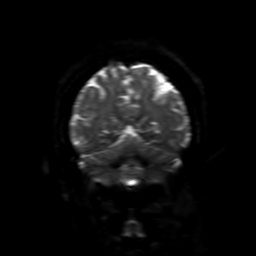
[im 29/72]
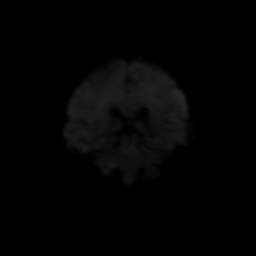
[im 36/72]
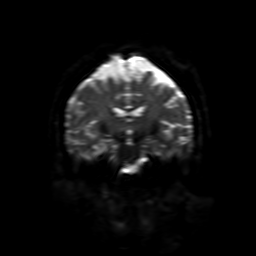
[im 43/72]
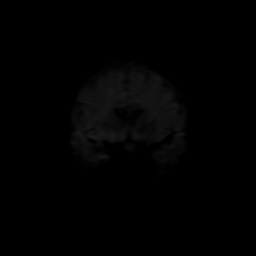
[im 50/72]
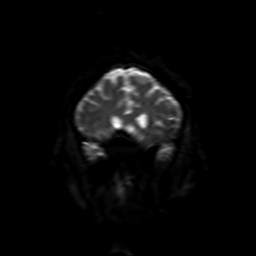
[im 57/72]
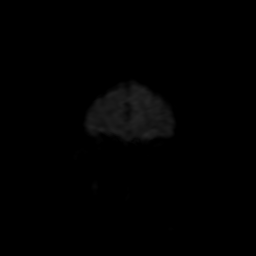
[im 64/72]
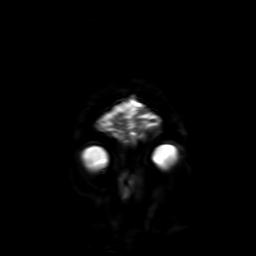
[im 72/72]
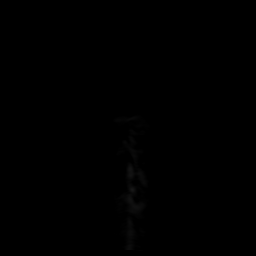

[Series 5: FLAIR · axial · 5.0mm · 0.47mm/px · z∈[-13,+121]mm · 4 of 24 slices shown]
[im 1/24]
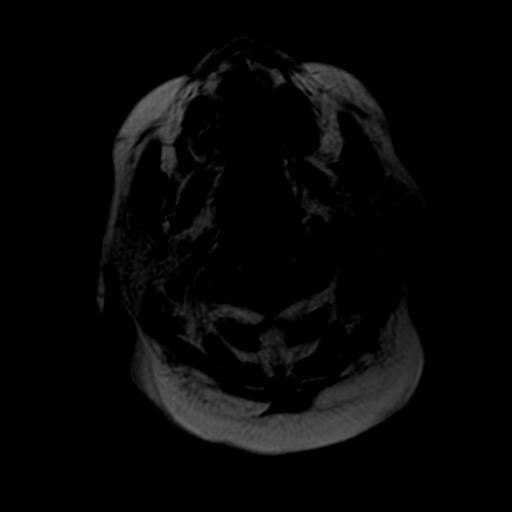
[im 8/24]
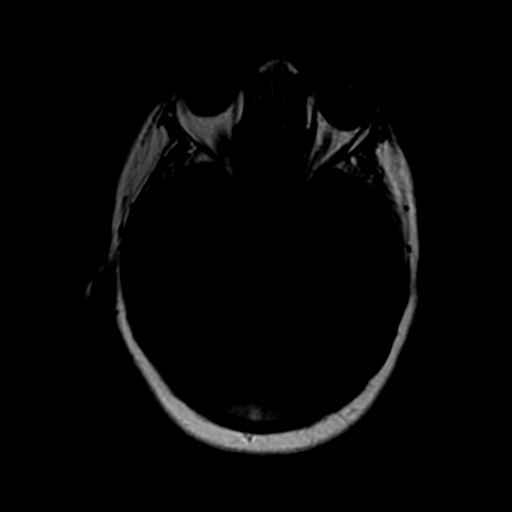
[im 16/24]
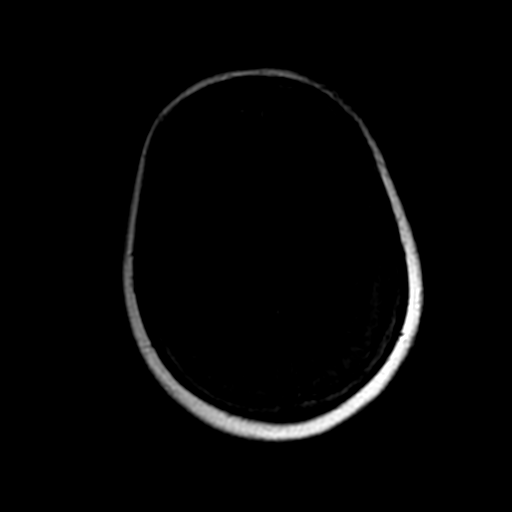
[im 24/24]
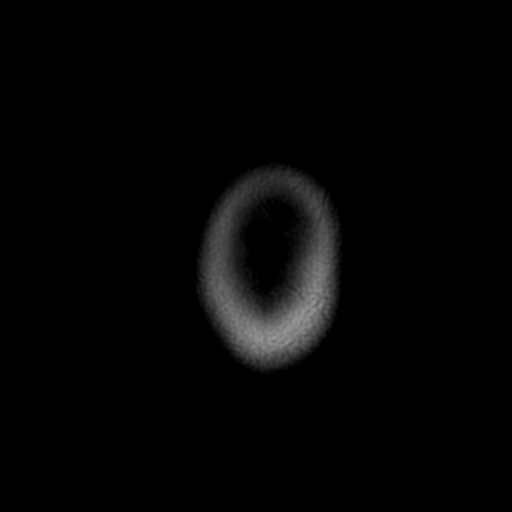

[Series 6: GRE · axial · 5.0mm · 0.43mm/px · z∈[-6,+125]mm · 4 of 26 slices shown]
[im 1/26]
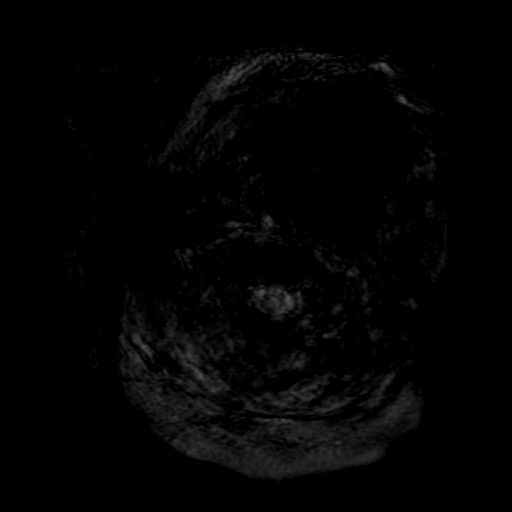
[im 9/26]
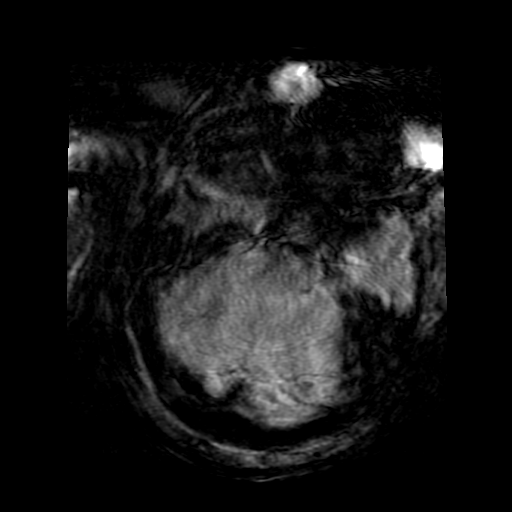
[im 17/26]
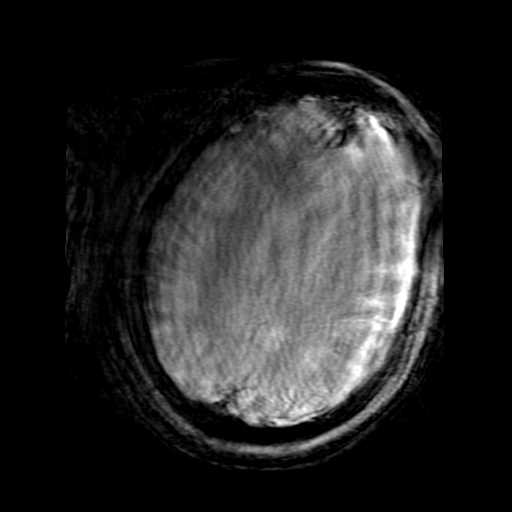
[im 26/26]
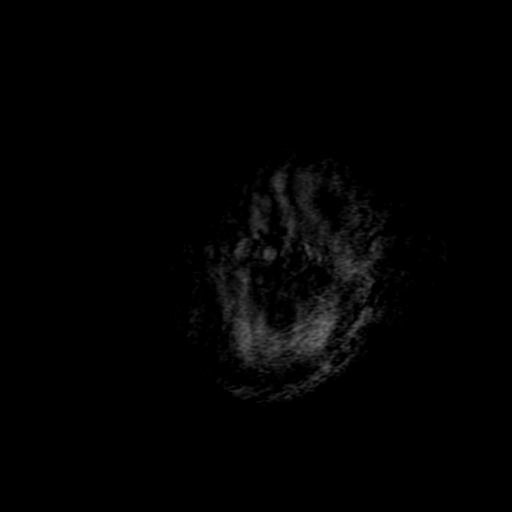

[Series 350: ADC · axial · 3.0mm · 0.94mm/px · z∈[-7,+132]mm · 8 of 50 slices shown (1 of 2)]
[im 1/50]
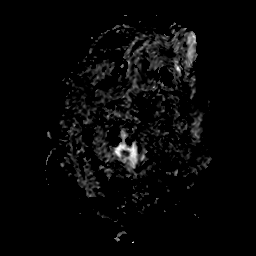
[im 8/50]
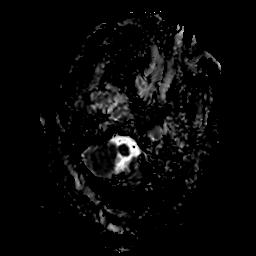
[im 15/50]
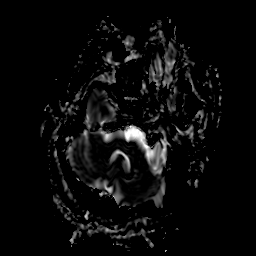
[im 22/50]
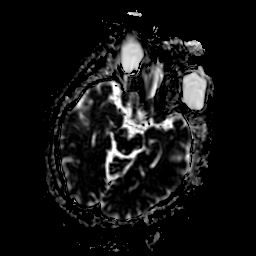
[im 29/50]
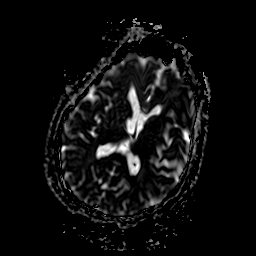
[im 36/50]
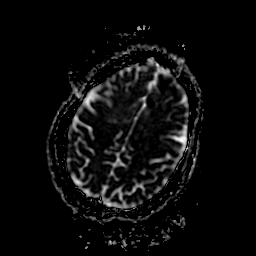
[im 43/50]
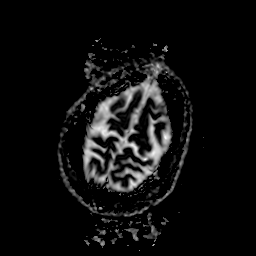
[im 50/50]
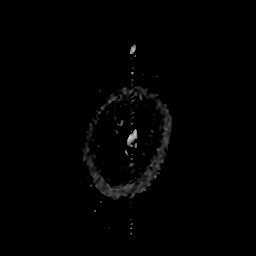

[Series 450: ADC · coronal · 4.0mm · 0.94mm/px · 6 of 36 slices shown (2 of 2)]
[im 1/36]
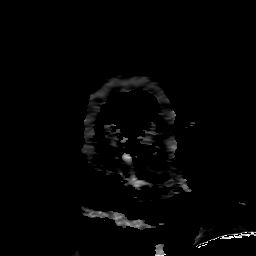
[im 8/36]
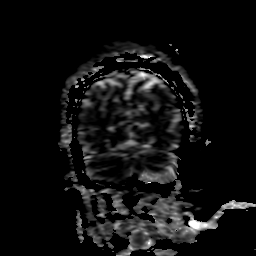
[im 15/36]
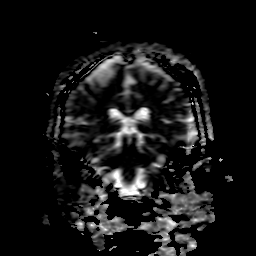
[im 22/36]
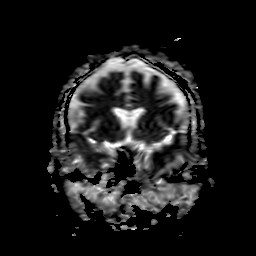
[im 29/36]
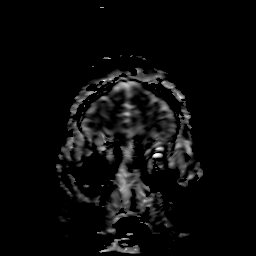
[im 36/36]
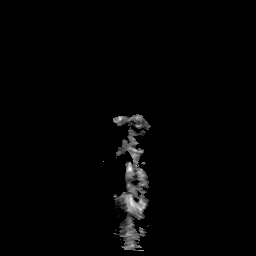

[45 of 48 positions shown; findings below may reference images not displayed]

FINDINGS: Study limited to DWI, axial FLAIR and T2* [HOSPITAL] the request of
neurology.

Diffusion-weighted imaging is mildly motion degraded, but both axial
and coronal DWI appear negative for restricted diffusion or acute
infarct. Also, no hippocampal formation diffusion abnormality is
identified.

Motion degraded but otherwise unremarkable FLAIR imaging. T2* also
appears negative. No intracranial mass effect. No ventriculomegaly.

Initially intracranial MRA was also planned but the examination had
to be discontinued prior to completion.
IMPRESSION: No evidence of acute ischemia.

This study was discussed both with JALON, and also Dr. JALON
at [1W] and [1W] hours respectively.
We agreed the constellation favors intracranial atherosclerosis over
emergent large vessel occlusion. And although there are no DWI
findings of status epilepticus, sequelae of seizure is still
possible.

## 2019-11-30 MED ORDER — SODIUM CHLORIDE 0.9% FLUSH
3.0000 mL | Freq: Once | INTRAVENOUS | Status: AC
  Administered 2019-11-30: 13:00:00 3 mL via INTRAVENOUS

## 2019-11-30 MED ORDER — IOHEXOL 350 MG/ML SOLN
100.0000 mL | Freq: Once | INTRAVENOUS | Status: AC | PRN
  Administered 2019-11-30: 100 mL via INTRAVENOUS

## 2019-11-30 MED ORDER — LORAZEPAM 2 MG/ML IJ SOLN: 2.0000 mg | Freq: Once | INTRAMUSCULAR | Status: AC

## 2019-11-30 MED ORDER — DEXTROSE 5 % IV SOLN
770.0000 mg | Freq: Three times a day (TID) | INTRAVENOUS | Status: DC
  Administered 2019-12-01 – 2019-12-03 (×7): 770 mg via INTRAVENOUS
  Filled 2019-11-30 (×11): qty 15.4

## 2019-11-30 MED ORDER — LIDOCAINE HCL (PF) 1 % IJ SOLN: 2.0000 mL | Freq: Once | INTRAMUSCULAR | Status: DC

## 2019-11-30 MED ORDER — DEXAMETHASONE SODIUM PHOSPHATE 10 MG/ML IJ SOLN
10.0000 mg | Freq: Once | INTRAMUSCULAR | Status: AC
  Administered 2019-11-30: 10 mg via INTRAVENOUS
  Filled 2019-11-30: qty 1

## 2019-11-30 MED ORDER — POLYETHYLENE GLYCOL 3350 17 G PO PACK: 17.0000 g | PACK | Freq: Every day | ORAL | Status: DC | PRN

## 2019-11-30 MED ORDER — SODIUM CHLORIDE 0.9 % IV SOLN
2.0000 g | Freq: Once | INTRAVENOUS | Status: AC
  Administered 2019-11-30: 2 g via INTRAVENOUS
  Filled 2019-11-30: qty 20

## 2019-11-30 MED ORDER — ENOXAPARIN SODIUM 40 MG/0.4ML ~~LOC~~ SOLN: 40.0000 mg | SUBCUTANEOUS | Status: DC

## 2019-11-30 MED ORDER — LEVETIRACETAM IN NACL 500 MG/100ML IV SOLN
500.0000 mg | Freq: Once | INTRAVENOUS | Status: AC
  Administered 2019-11-30: 500 mg via INTRAVENOUS
  Filled 2019-11-30: qty 100

## 2019-11-30 MED ORDER — ALBUTEROL SULFATE HFA 108 (90 BASE) MCG/ACT IN AERS
2.0000 | INHALATION_SPRAY | RESPIRATORY_TRACT | Status: DC | PRN
  Filled 2019-11-30: qty 6.7

## 2019-11-30 MED ORDER — DEXTROSE 5 % IV SOLN
770.0000 mg | Freq: Once | INTRAVENOUS | Status: AC
  Administered 2019-11-30: 770 mg via INTRAVENOUS
  Filled 2019-11-30: qty 15.4

## 2019-11-30 MED ORDER — ONDANSETRON HCL 4 MG/2ML IJ SOLN: 4.0000 mg | Freq: Four times a day (QID) | INTRAMUSCULAR | Status: DC | PRN

## 2019-11-30 MED ORDER — LORAZEPAM 2 MG/ML IJ SOLN
INTRAMUSCULAR | Status: AC
  Administered 2019-11-30: 2 mg via INTRAVENOUS
  Filled 2019-11-30: qty 1

## 2019-11-30 MED ORDER — SODIUM CHLORIDE 0.9 % IV SOLN: 2000.0000 mg | INTRAVENOUS | Status: DC

## 2019-11-30 MED ORDER — ACETAMINOPHEN 650 MG RE SUPP: 650.0000 mg | RECTAL | Status: DC | PRN

## 2019-11-30 MED ORDER — DEXAMETHASONE SODIUM PHOSPHATE 10 MG/ML IJ SOLN: 10.0000 mg | Freq: Once | INTRAMUSCULAR | Status: DC

## 2019-11-30 MED ORDER — LORAZEPAM 2 MG/ML IJ SOLN: 1.0000 mg | INTRAMUSCULAR | Status: DC | PRN

## 2019-11-30 MED ORDER — VANCOMYCIN HCL IN DEXTROSE 1-5 GM/200ML-% IV SOLN: 1000.0000 mg | Freq: Two times a day (BID) | INTRAVENOUS | Status: DC

## 2019-11-30 MED ORDER — ONDANSETRON HCL 4 MG PO TABS: 4.0000 mg | ORAL_TABLET | Freq: Four times a day (QID) | ORAL | Status: DC | PRN

## 2019-11-30 MED ORDER — LORAZEPAM 2 MG/ML IJ SOLN
INTRAMUSCULAR | Status: AC
  Administered 2019-11-30: 2 mg
  Filled 2019-11-30: qty 1

## 2019-11-30 MED ORDER — ENOXAPARIN SODIUM 40 MG/0.4ML ~~LOC~~ SOLN
40.0000 mg | SUBCUTANEOUS | Status: DC
  Administered 2019-11-30 – 2019-12-02 (×3): 40 mg via SUBCUTANEOUS
  Filled 2019-11-30 (×3): qty 0.4

## 2019-11-30 MED ORDER — VANCOMYCIN HCL 2000 MG/400ML IV SOLN
2000.0000 mg | Freq: Once | INTRAVENOUS | Status: AC
  Administered 2019-11-30: 2000 mg via INTRAVENOUS
  Filled 2019-11-30: qty 400

## 2019-11-30 MED ORDER — VANCOMYCIN HCL IN DEXTROSE 1-5 GM/200ML-% IV SOLN: 1000.0000 mg | Freq: Once | INTRAVENOUS | Status: DC

## 2019-11-30 MED ORDER — DOCUSATE SODIUM 100 MG PO CAPS
100.0000 mg | ORAL_CAPSULE | Freq: Two times a day (BID) | ORAL | Status: DC
  Administered 2019-12-01 – 2019-12-03 (×4): 100 mg via ORAL
  Filled 2019-11-30 (×3): qty 1

## 2019-11-30 MED ORDER — HYDRALAZINE HCL 20 MG/ML IJ SOLN: 5.0000 mg | INTRAMUSCULAR | Status: DC | PRN

## 2019-11-30 MED ORDER — LACTATED RINGERS IV SOLN: INTRAVENOUS | Status: DC

## 2019-11-30 MED ORDER — SODIUM CHLORIDE 0.9 % IV SOLN
2.0000 g | Freq: Two times a day (BID) | INTRAVENOUS | Status: DC
  Administered 2019-12-01: 2 g via INTRAVENOUS
  Filled 2019-11-30: qty 20

## 2019-11-30 MED ORDER — LEVETIRACETAM IN NACL 1500 MG/100ML IV SOLN
1500.0000 mg | Freq: Once | INTRAVENOUS | Status: AC
  Administered 2019-11-30: 1500 mg via INTRAVENOUS
  Filled 2019-11-30: qty 100

## 2019-11-30 MED ORDER — ACETAMINOPHEN 325 MG PO TABS: 650.0000 mg | ORAL_TABLET | ORAL | Status: DC | PRN

## 2019-11-30 NOTE — Progress Notes (Signed)
Pharmacy Antibiotic Note  Julia Medina is a 64 y.o. female admitted on 11/30/2019 with R sided deficits and confusion concerning for meningitis.  Pharmacy has been consulted for acyclovir and vancomycin dosing.   WBC elevated at 16.6. SCr wnl. Currently unable to weigh patient but recently weighed 108 kg on 09/13/19. Adjusted body weight ~ 77 kg. nCrCl ~ 92 mL/min   Plan: -Start acyclovir 10 mg/kg adjusted body weight Q 8 hours. Adjust dosage based on updated weight as necessary  -Vancomycin 2 gm IV load followed by vancomycin 1 gm IV Q 12 hours -F/u maintenance ceftriaxone orders -Monitor CBC, renal fx, cultures and clinical progress -VT at [facility]  -F/u CSF cultures      Temp (24hrs), Avg:101.8 F (38.8 C), Min:101.8 F (38.8 C), Max:101.8 F (38.8 C)  Recent Labs  Lab 11/30/19 1248 11/30/19 1251  WBC 16.6*  --   CREATININE 1.01* 0.60    CrCl cannot be calculated (Unknown ideal weight.).    Allergies  Allergen Reactions  . Aspirin Rash  . Penicillins Rash  . Sulfa Antibiotics Rash    Antimicrobials this admission: Ceftriaxone 9/29 >>  Vancomycin 9/29 >>  Acyclovir 9/29 >>   Dose adjustments this admission:  Microbiology results: 9/29 BCx:    Thank you for allowing pharmacy to be a part of this patient's care.  Vinnie Level, PharmD., BCPS, BCCCP Clinical Pharmacist Clinical phone for 11/30/19 until 11:30pm: [phone] If after 11:30pm, please refer to [facility] for unit-specific pharmacist

## 2019-11-30 NOTE — ED Triage Notes (Addendum)
Arrived via EMS; Code Stroke; LWK 0100; per family. Been feeling unwell since Monday. And right sided at 1 am; seizure act en route EMS medicated with midazolam 2.5 mg; LVO +; and left sided gaze.

## 2019-11-30 NOTE — ED Notes (Signed)
Patient taken to MRI, noted Sepsis work up initiated, delayed d/t CODE Stroke.

## 2019-11-30 NOTE — Progress Notes (Signed)
Rounded on patient that is holding in Er as progressive status.  Reviewed chart.  Ms. Keelin admitted with metabolic encephalopathy with possible seizure versus meningitis.  LP obtained by radiology.  Showed WBCs of 24, with neutrophils of 89, lymphocytes 0, eosinophils 0 with 11 monocytes-macrophage.  CSF has a glucose of 202.  Patient been treated with Rocephin and vancomycin.  She is also on acyclovir.  She is hemodynamically stable and afebrile.  No further seizure activity.   Care plan reviewed with RN

## 2019-11-30 NOTE — Progress Notes (Signed)
Patient family in consult room. I spoke with patients nurse and patient may be going for further test. Advised family that they can visit bedside when nurse is ready.  Nurse will notify family when it's time.  Will follow as needed.  Venida Jarvis, [place], [facility], Pager [phone]

## 2019-11-30 NOTE — ED Notes (Signed)
EDP at bedside for LP

## 2019-11-30 NOTE — ED Notes (Signed)
Taken to IR for LP

## 2019-11-30 NOTE — Procedures (Signed)
Procedure: Lumbar Puncture w Fluoro guidance. Specimen: CSF, to lab Bleeding: none. Complications: None immediate. Patient   -Condition: Stable.  -Disposition:  ED Code Stroke patient, being admitted.  Full Radiology Report to Follow under IMAGING

## 2019-11-30 NOTE — ED Notes (Signed)
Patient transported to MRI 

## 2019-11-30 NOTE — ED Provider Notes (Signed)
[facility] EMERGENCY DEPARTMENT Provider Note   CSN: [id number] Arrival date & time: 11/30/19  1242  An emergency department physician performed an initial assessment on this suspected stroke patient at 1242.  History Chief Complaint  Patient presents with  . Code Stroke    Julia Medina is a 64 y.o. female.  The history is provided by the EMS personnel and medical records. No language interpreter was used.   Julia Medina is a 64 y.o. female who presents to the Emergency Department complaining of code stroke. Level V caveat due to confusion. History is provided by EMS. She presents the emergency department as a code stroke for right-sided deficits. Symptoms began at 1 AM with right-sided paralysis. EMS was called out due to worsening confusion. On EMS arrival she did have seizure activity and she was treated with 2.5 mg of Versed.    Past Medical History:  Diagnosis Date  . COPD (chronic obstructive pulmonary disease) (HCC)     There are no problems to display for this patient.  HX/o HTN, CAD    OB History   No obstetric history on file.     No family history on file.  Social History   Tobacco Use  . Smoking status: Not on file  Substance Use Topics  . Alcohol use: Not on file  . Drug use: Not on file    Home Medications Prior to Admission medications   Medication Sig Start Date End Date Taking? Authorizing Provider  acetaminophen (TYLENOL) 650 MG CR tablet Take 650-1,300 mg by mouth every 8 (eight) hours as needed for pain.   Yes [provider]  albuterol (PROVENTIL HFA;VENTOLIN HFA) 108 (90 Base) MCG/ACT inhaler Inhale 2 puffs into the lungs every 4 (four) hours as needed for wheezing or shortness of breath. 12/02/16  Yes Lawyer, Cristal Deer, PA-C  clopidogrel (PLAVIX) 75 MG tablet Take 75 mg by mouth daily. 06/21/19  Yes [provider]  furosemide (LASIX) 20 MG tablet Take 20 mg by mouth daily. 08/18/19  Yes  [provider]  hydrochlorothiazide (HYDRODIURIL) 25 MG tablet Take 25 mg by mouth daily. 09/05/19  Yes [provider]  metoprolol tartrate (LOPRESSOR) 50 MG tablet Take 50 mg by mouth 2 (two) times daily. 06/20/19  Yes [provider]  omeprazole (PRILOSEC) 20 MG capsule Take 20 mg by mouth daily.   Yes [provider]  simvastatin (ZOCOR) 80 MG tablet Take 1 tablet by mouth at bedtime. 09/22/19  Yes [provider]  predniSONE (DELTASONE) 50 MG tablet Take 1 tablet (50 mg total) by mouth daily. Patient not taking: Reported on 11/30/2019 12/02/16   Charlestine Night, PA-C  tiotropium (SPIRIVA HANDIHALER) 18 MCG inhalation capsule Place 1 capsule (18 mcg total) into inhaler and inhale daily. Patient not taking: Reported on 11/30/2019 12/02/16   Charlestine Night, PA-C    Allergies    Aspirin, Penicillins, and Sulfa antibiotics  Review of Systems   Review of Systems  All other systems reviewed and are negative.   Physical Exam Updated Vital Signs BP (!) 148/85   Pulse (!) 112   Temp (!) 101.8 F (38.8 C) (Oral)   Resp 20   SpO2 96%   Physical Exam Vitals and nursing note reviewed.  Constitutional:      Appearance: She is well-developed.  HENT:     Head: Normocephalic and atraumatic.  Cardiovascular:     Rate and Rhythm: Regular rhythm. Tachycardia present.     Heart sounds:  No murmur heard.   Pulmonary:     Effort: Pulmonary effort is normal. No respiratory distress.     Breath sounds: Normal breath sounds.  Abdominal:     Palpations: Abdomen is soft.     Tenderness: There is no abdominal tenderness. There is no guarding or rebound.  Musculoskeletal:        General: No tenderness.  Skin:    General: Skin is warm and dry.  Neurological:     Mental Status: She is alert.     Comments: Nonverbal. Left-sided gaze preference. Right hemiparesis.  Psychiatric:     Comments: Unable to assess     ED Results / Procedures /  Treatments   Labs (all labs ordered are listed, but only abnormal results are displayed) Labs Reviewed  CBC - Abnormal; Notable for the following components:      Result Value   WBC 16.6 (*)    RBC 5.60 (*)    Hemoglobin 16.4 (*)    HCT 49.5 (*)    All other components within normal limits  DIFFERENTIAL - Abnormal; Notable for the following components:   Neutro Abs 12.8 (*)    Monocytes Absolute 1.2 (*)    Abs Immature Granulocytes 0.10 (*)    All other components within normal limits  COMPREHENSIVE METABOLIC PANEL - Abnormal; Notable for the following components:   CO2 18 (*)    Glucose, Bld 337 (*)    Creatinine, Ser 1.01 (*)    GFR calc non Af Amer 59 (*)    Anion gap 17 (*)    All other components within normal limits  CBG MONITORING, ED - Abnormal; Notable for the following components:   Glucose-Capillary 373 (*)    All other components within normal limits  I-STAT CHEM 8, ED - Abnormal; Notable for the following components:   Glucose, Bld 340 (*)    Calcium, Ion 1.13 (*)    TCO2 17 (*)    Hemoglobin 17.0 (*)    HCT 50.0 (*)    All other components within normal limits  RESPIRATORY PANEL BY RT PCR (FLU A&B, COVID)  CULTURE, BLOOD (ROUTINE X 2)  CULTURE, BLOOD (ROUTINE X 2)  CSF CULTURE  GRAM STAIN  PROTIME-INR  APTT  LACTIC ACID, PLASMA  LACTIC ACID, PLASMA  CSF CELL COUNT WITH DIFFERENTIAL  CSF CELL COUNT WITH DIFFERENTIAL  PROTEIN AND GLUCOSE, CSF  HIV ANTIBODY (ROUTINE TESTING W REFLEX)  HERPES SIMPLEX VIRUS(HSV) DNA BY PCR  VARICELLA-ZOSTER BY PCR  CBG MONITORING, ED    EKG EKG Interpretation  Date/Time:  Wednesday November 30 2019 13:23:26 EDT Ventricular Rate:  108 PR Interval:    QRS Duration: 98 QT Interval:  376 QTC Calculation: 504 R Axis:   62 Text Interpretation: Sinus tachycardia Probable lateral infarct, old Prolonged QT interval Confirmed by Tilden Fossa [phone]) on 11/30/2019 1:30:20 PM   Radiology MR BRAIN WO CONTRAST  Result  Date: 11/30/2019 CLINICAL DATA:  64 year old female code stroke presentation, with seizure in the CT suite. CTA suspicious for left MCA ELVO, but negative CT Perfusion and evidence of underlying intracranial atherosclerosis. EXAM: MRI HEAD WITHOUT CONTRAST TECHNIQUE: Multiplanar, multiecho pulse sequences of the brain and surrounding structures were obtained without intravenous contrast. COMPARISON:  CT head, CTP and CTA today. FINDINGS: Study limited to DWI, axial FLAIR and T2* imaging at the request of neurology. Diffusion-weighted imaging is mildly motion degraded, but both axial and coronal DWI appear negative for restricted diffusion or acute infarct. Also, no  hippocampal formation diffusion abnormality is identified. Motion degraded but otherwise unremarkable FLAIR imaging. T2* also appears negative. No intracranial mass effect. No ventriculomegaly. Initially intracranial MRA was also planned but the examination had to be discontinued prior to completion. IMPRESSION: No evidence of acute ischemia. This study was discussed both with NIR, and also Dr. Marisue Humble at 1420 and 1430 hours respectively. We agreed the constellation favors intracranial atherosclerosis over emergent large vessel occlusion. And although there are no DWI findings of status epilepticus, sequelae of seizure is still possible. Electronically Signed   By: Odessa Fleming M.D.   On: 11/30/2019 14:51   CT CEREBRAL PERFUSION W CONTRAST  Result Date: 11/30/2019 CLINICAL DATA:  64 year old female with right side hemiparesis and leftward gaze. Had a seizure in the CT suite. Last known well 0130 hours. Negative plain head CT at 1257 hours today. EXAM: CT ANGIOGRAPHY HEAD AND NECK CT PERFUSION BRAIN TECHNIQUE: Multidetector CT imaging of the head and neck was performed using the standard protocol during bolus administration of intravenous contrast. Multiplanar CT image reconstructions and MIPs were obtained to evaluate the vascular anatomy. Carotid  stenosis measurements (when applicable) are obtained utilizing NASCET criteria, using the distal internal carotid diameter as the denominator. Multiphase CT imaging of the brain was performed following IV bolus contrast injection. Subsequent parametric perfusion maps were calculated using RAPID software. CONTRAST:  OMNIPAQUE IOHEXOL 350 MG/ML SOLN COMPARISON:  Head CT 1257 hours today. FINDINGS: CT Brain Perfusion Findings: ASPECTS: 10 CBF (<30%) Volume: 0mL Perfusion (Tmax>6.0s) volume: 0mL Mismatch Volume: Not applicable Infarction Location:No infarct core detected. No CBF or CBV abnormality. There is 79 mL of T-max greater than 4 seconds which is mostly but not completely in the posterior left MCA territory. CTA NECK Skeleton: Absent maxillary dentition. No acute osseous abnormality identified. Upper chest: Negative. Other neck: Mild thyroid enlargement. No discrete thyroid nodule. Upper limits of normal to mildly enlarged bilateral level 1B/2A lymph nodes, up to 9 mm short axis and slightly greater on the left. No cystic or necrotic nodes are evident. Other neck soft tissue contours are within normal limits. Aortic arch: Aberrant origin of the right subclavian artery, with shared origin of the CCA is. Three vessel arch configuration. Mild arch atherosclerosis. Right carotid system: Mildly tortuous proximal right CCA. No stenosis proximal to the bifurcation. Minimal plaque at the bifurcation. No cervical right ICA stenosis. Left carotid system: Mildly tortuous proximal left CCA with no plaque or stenosis. Mild soft and calcified plaque at the lateral left ICA origin without stenosis to the skull base. 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 Normal right vertebral artery origin. Mildly tortuous right V1 segment. Mildly non dominant right vertebral artery. Tortuosity at the C3 level. Patent right vertebral to the skull base without  plaque or stenosis. Minimal plaque in the proximal left subclavian artery without stenosis. Normal left vertebral artery origin. Mildly dominant and tortuous left vertebral artery is patent to the skull base without plaque or stenosis. CTA HEAD Suboptimal intracranial IV contrast bolus. Posterior circulation: Normal V4 segments, the left is mildly dominant. Normal left PICA origin. The right AICA appears dominant and patent. Patent vertebrobasilar junction and basilar artery without stenosis. Patent SCA and PCA origins. Posterior communicating arteries are diminutive or absent. Both PCA P1 and P2 segments are patent. Both PCA bifurcations appear patent but there is asymmetrically decreased distal left PCA flow on the left (series 10, image 23). No discrete PCA  branch enhancement is evident. Anterior circulation: Both ICA siphons are patent. On the right there is moderate siphon calcified plaque and moderate to severe stenosis just distal to the anterior genu (series 6, image 90). The right ICA terminus remains patent. On the left heavily calcified distal cavernous and supraclinoid ICA also, with moderate to severe supraclinoid stenosis as well (series 5, image 88). Patent left carotid terminus.  Patent MCA and ACA origins. Diminutive and irregular ACA A1 appearance a especially on the right where moderate to severe stenosis is noted on series 9, image 21. But no ACA branch occlusion is identified. 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 No right M2 occlusion is identified. On the left side the M1 is patent to the bifurcation and tortuous (series 8, image 18). At the bifurcation there are asymmetrically fewer enhancing left MCA branches suggesting 1 or more M2 occlusions (see series 10, image 27 versus series 10, image 13 on the right). And M2 occlusion is suspected on the source images series 7, image 117.  But other MCA branch detail is limited. Venous sinuses: Not evaluated due to early/suboptimal contrast. Anatomic variants: Aberrant origin right subclavian artery. Shared CCA origin. Review of the MIP images confirms the above findings IMPRESSION: 1. 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 2. Furthermore, the intracranial contrast bolus is suboptimal and there is intracranial atherosclerosis, including up to Severe bilateral Supraclinoid ICA stenoses. 3. There is also asymmetric decreased enhancement of the distal left PCA branches. 4. 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 5. But minimal atherosclerosis and no arterial stenosis in the neck. 6. Mild nonspecific level 2 cervical lymphadenopathy. [facility]ient V[facility] and CTP findings reviewed in person with Dr. Thomasena Edis and PA [facility] on 11/30/2019 at 1323 hours. And in discussion that included NIR, decision was made for follow-up stat Brain MRI (to include at least DWI and FLAIR) and intracranial MRA to augment the decision making in this case. Electronically Signed   By: Odessa Fleming M.D.   On: 11/30/2019 13:40   DG Chest Port 1 View  Result Date: 11/30/2019 CLINICAL DATA:  Altered mental status. EXAM: PORTABLE CHEST 1 VIEW COMPARISON:  December 02, 2016. FINDINGS: Stable cardiomediastinal silhouette. Bilateral interstitial opacities are noted which may represent chronic scarring or interstitial lung disease. Acute superimposed edema or inflammation cannot be excluded. No pneumothorax or pleural effusion is noted. Bony thorax is unremarkable. IMPRESSION: Bilateral interstitial opacities are noted which may represent chronic scarring or interstitial lung disease. Acute superimposed edema or inflammation cannot be excluded. Electronically Signed   By: Lupita Raider M.D.   On: 11/30/2019 13:45   CT HEAD CODE STROKE WO CONTRAST  Result Date: 11/30/2019 CLINICAL  DATA:  Code stroke.  Acute neuro deficit.  Left-sided gaze. EXAM: CT HEAD WITHOUT CONTRAST TECHNIQUE: Contiguous axial images were obtained from the base of the skull through the vertex without intravenous contrast. COMPARISON:  None. FINDINGS: Brain: No evidence of acute infarction, hemorrhage, hydrocephalus, extra-axial collection or mass lesion/mass effect. Motion degraded study. Vascular: Negative for hyperdense vessel. Skull: Negative Sinuses/Orbits: Paranasal sinuses clear.  Negative orbit. Other: None ASPECTS (Alberta Stroke Program Early CT Score) - Ganglionic level infarction (caudate, lentiform nuclei, internal capsule, insula, M1-M3 cortex): 7 - Supraganglionic infarction (M4-M6 cortex): 3 Total score (0-10 with  10 being normal): 10 IMPRESSION: 1. Negative CT head 2. ASPECTS is 10 3. Code stroke imaging results were communicated on 11/30/2019 at 12:58 pm to provider [facility] [place] via secure text paging. Electronically Signed   By: Marlan Palauharles  Clark M.D.   On: 11/30/2019 12:59   CT ANGIO HEAD CODE STROKE  Result Date: 11/30/2019 CLINICAL DATA:  64 year old female with right side hemiparesis and leftward gaze. Had a seizure in the CT suite. Last known well 0130 hours. Negative plain head CT at 1257 hours today. EXAM: CT ANGIOGRAPHY HEAD AND NECK CT PERFUSION BRAIN TECHNIQUE: Multidetector CT imaging of the head and neck was performed using the standard protocol during bolus administration of intravenous contrast. Multiplanar CT image reconstructions and MIPs were obtained to evaluate the vascular anatomy. Carotid stenosis measurements (when applicable) are obtained utilizing NASCET criteria, using the distal internal carotid diameter as the denominator. Multiphase CT imaging of the brain was performed following IV bolus contrast injection. Subsequent parametric perfusion maps were calculated using RAPID software. CONTRAST:  100mL OMNIPAQUE IOHEXOL 350 MG/ML SOLN COMPARISON:  Head CT 1257 hours today. FINDINGS: CT  Brain Perfusion Findings: ASPECTS: 10 CBF (<30%) Volume: 0mL Perfusion (Tmax>6.0s) volume: 0mL Mismatch Volume: Not applicable Infarction Location:No infarct core detected. No CBF or CBV abnormality. There is 79 mL of T-max greater than 4 seconds which is mostly but not completely in the posterior left MCA territory. CTA NECK Skeleton: Absent maxillary dentition. No acute osseous abnormality identified. Upper chest: Negative. Other neck: Mild thyroid enlargement. No discrete thyroid nodule. Upper limits of normal to mildly enlarged bilateral level 1B/2A lymph nodes, up to 9 mm short axis and slightly greater on the left. No cystic or necrotic nodes are evident. Other neck soft tissue contours are within normal limits. Aortic arch: Aberrant origin of the right subclavian artery, with shared origin of the CCA is. Three vessel arch configuration. Mild arch atherosclerosis. Right carotid system: Mildly tortuous proximal right CCA. No stenosis proximal to the bifurcation. Minimal plaque at the bifurcation. No cervical right ICA stenosis. Left carotid system: Mildly tortuous proximal left CCA with no plaque or stenosis. Mild soft and calcified plaque at the lateral left ICA origin without stenosis to the skull base. 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 Normal right vertebral artery origin. Mildly tortuous right V1 segment. Mildly non dominant right vertebral artery. Tortuosity at the C3 level. Patent right vertebral to the skull base without plaque or stenosis. Minimal plaque in the proximal left subclavian artery without stenosis. Normal left vertebral artery origin. Mildly dominant and tortuous left vertebral artery is patent to the skull base without plaque or stenosis. CTA HEAD Suboptimal intracranial IV contrast bolus. Posterior circulation: Normal V4 segments, the left is mildly dominant. Normal left PICA origin. The right AICA appears  dominant and patent. Patent vertebrobasilar junction and basilar artery without stenosis. Patent SCA and PCA origins. Posterior communicating arteries are diminutive or absent. Both PCA P1 and P2 segments are patent. Both PCA bifurcations appear patent but there is asymmetrically decreased distal left PCA flow on the left (series 10, image 23). No discrete PCA branch enhancement is evident. Anterior circulation: Both ICA siphons are patent. On the right there is moderate siphon calcified plaque and moderate to severe stenosis just distal to the anterior genu (series 6, image 90). The right ICA terminus remains patent. On the left heavily calcified distal cavernous and supraclinoid ICA also, with moderate to severe supraclinoid stenosis as  well (series 5, image 88). Patent left carotid terminus.  Patent MCA and ACA origins. Diminutive and irregular ACA A1 appearance a especially on the right where moderate to severe stenosis is noted on series 9, image 21. But no ACA branch occlusion is identified. 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 No right M2 occlusion is identified. On the left side the M1 is patent to the bifurcation and tortuous (series 8, image 18). At the bifurcation there are asymmetrically fewer enhancing left MCA branches suggesting 1 or more M2 occlusions (see series 10, image 27 versus series 10, image 13 on the right). And M2 occlusion is suspected on the source images series 7, image 117. But other MCA branch detail is limited. Venous sinuses: Not evaluated due to early/suboptimal contrast. Anatomic variants: Aberrant origin right subclavian artery. Shared CCA origin. Review of the MIP images confirms the above findings IMPRESSION: 1. 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 2. Furthermore, the intracranial  contrast bolus is suboptimal and there is intracranial atherosclerosis, including up to Severe bilateral Supraclinoid ICA stenoses. 3. There is also asymmetric decreased enhancement of the distal left PCA branches. 4. 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 5. But minimal atherosclerosis and no arterial stenosis in the neck. 6. Mild nonspecific level 2 cervical lymphadenopathy. Salient Vascular and CTP findings reviewed in person with Dr. Thomasena Edis and PA [facility]ID [facility] on 11/30/2019 at 1323 hours. And in discussion that included NIR, decision was made for follow-up stat Brain MRI (to include at least DWI and FLAIR) and intracranial MRA to augment the decision making in this case. Electronically Signed   By: Odessa Fleming M.D.   On: 11/30/2019 13:40   CT ANGIO NECK CODE STROKE  Result Date: 11/30/2019 CLINICAL DATA:  64 year old female with right side hemiparesis and leftward gaze. Had a seizure in the CT suite. Last known well 0130 hours. Negative plain head CT at 1257 hours today. EXAM: CT ANGIOGRAPHY HEAD AND NECK CT PERFUSION BRAIN TECHNIQUE: Multidetector CT imaging of the head and neck was performed using the standard protocol during bolus administration of intravenous contrast. Multiplanar CT image reconstructions and MIPs were obtained to evaluate the vascular anatomy. Carotid stenosis measurements (when applicable) are obtained utilizing NASCET criteria, using the distal internal carotid diameter as the denominator. Multiphase CT imaging of the brain was performed following IV bolus contrast injection. Subsequent parametric perfusion maps were calculated using RAPID software. CONTRAST:  OMNIPAQUE IOHEXOL 350 MG/ML SOLN COMPARISON:  Head CT 1257 hours today. FINDINGS: CT Brain Perfusion Findings: ASPECTS: 10 CBF (<30%) Volume: 0mL Perfusion (Tmax>6.0s) volume: 0mL Mismatch Volume: Not applicable Infarction Location:No infarct core detected. No CBF or CBV abnormality. There is 79  mL of T-max greater than 4 seconds which is mostly but not completely in the posterior left MCA territory. CTA NECK Skeleton: Absent maxillary dentition. No acute osseous abnormality identified. Upper chest: Negative. Other neck: Mild thyroid enlargement. No discrete thyroid nodule. Upper limits of normal to mildly enlarged bilateral level 1B/2A lymph nodes, up to 9 mm short axis and slightly greater on the left. No cystic or necrotic nodes are evident. Other neck soft tissue contours are within normal limits. Aortic arch: Aberrant origin of the right subclavian artery, with shared origin of the CCA is. Three vessel arch  configuration. Mild arch atherosclerosis. Right carotid system: Mildly tortuous proximal right CCA. No stenosis proximal to the bifurcation. Minimal plaque at the bifurcation. No cervical right ICA stenosis. Left carotid system: Mildly tortuous proximal left CCA with no plaque or stenosis. Mild soft and calcified plaque at the lateral left ICA origin without stenosis to the skull base. 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 Normal right vertebral artery origin. Mildly tortuous right V1 segment. Mildly non dominant right vertebral artery. Tortuosity at the C3 level. Patent right vertebral to the skull base without plaque or stenosis. Minimal plaque in the proximal left subclavian artery without stenosis. Normal left vertebral artery origin. Mildly dominant and tortuous left vertebral artery is patent to the skull base without plaque or stenosis. CTA HEAD Suboptimal intracranial IV contrast bolus. Posterior circulation: Normal V4 segments, the left is mildly dominant. Normal left PICA origin. The right AICA appears dominant and patent. Patent vertebrobasilar junction and basilar artery without stenosis. Patent SCA and PCA origins. Posterior communicating arteries are diminutive or absent. Both PCA P1 and P2 segments are patent.  Both PCA bifurcations appear patent but there is asymmetrically decreased distal left PCA flow on the left (series 10, image 23). No discrete PCA branch enhancement is evident. Anterior circulation: Both ICA siphons are patent. On the right there is moderate siphon calcified plaque and moderate to severe stenosis just distal to the anterior genu (series 6, image 90). The right ICA terminus remains patent. On the left heavily calcified distal cavernous and supraclinoid ICA also, with moderate to severe supraclinoid stenosis as well (series 5, image 88). Patent left carotid terminus.  Patent MCA and ACA origins. Diminutive and irregular ACA A1 appearance a especially on the right where moderate to severe stenosis is noted on series 9, image 21. But no ACA branch occlusion is identified. 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 No right M2 occlusion is identified. On the left side the M1 is patent to the bifurcation and tortuous (series 8, image 18). At the bifurcation there are asymmetrically fewer enhancing left MCA branches suggesting 1 or more M2 occlusions (see series 10, image 27 versus series 10, image 13 on the right). And M2 occlusion is suspected on the source images series 7, image 117. But other MCA branch detail is limited. Venous sinuses: Not evaluated due to early/suboptimal contrast. Anatomic variants: Aberrant origin right subclavian artery. Shared CCA origin. Review of the MIP images confirms the above findings IMPRESSION: 1. 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 2. Furthermore, the intracranial contrast bolus is suboptimal and there is intracranial atherosclerosis, including up to Severe bilateral Supraclinoid ICA stenoses. 3. There is also asymmetric decreased enhancement of the distal left PCA branches. 4.  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 5. But minimal atherosclerosis and no arterial stenosis in the neck. 6. Mild nonspecific level 2 cervical lymphadenopathy. [facility] and CTP findings reviewed in person with Dr. Thomasena Edis and PA [facility] on 11/30/2019 at 1323 hours. And in discussion that included NIR, decision was made for follow-up stat Brain MRI (to include at least DWI and FLAIR) and intracranial MRA to augment the decision making in this case. Electronically Signed   By: Odessa Fleming M.D.   On:  11/30/2019 13:40    Procedures .Lumbar Puncture  Date/Time: 11/30/2019 3:31 PM Performed by: Tilden Fossa, MD Authorized by: Tilden Fossa, MD   Consent:    Consent obtained:  Verbal   Consent given by:  Spouse   Risks discussed:  Bleeding, infection, pain, nerve damage and headache Pre-procedure details:    Procedure purpose:  Diagnostic Procedure details:    Lumbar space:  L4-L5 interspace   Patient position:  R lateral decubitus   Needle gauge:  22   Needle length (in):  3.5   Number of attempts:  2 Post-procedure:    Puncture site:  Adhesive bandage applied   Patient tolerance of procedure:  Tolerated well, no immediate complications Comments:     Unable to obtain CSF   (including critical care time) CRITICAL CARE Performed by: Tilden Fossa   Total critical care time: 40 minutes  Critical care time was exclusive of separately billable procedures and treating other patients.  Critical care was necessary to treat or prevent imminent or life-threatening deterioration.  Critical care was time spent personally by me on the following activities: development of treatment plan with patient and/or surrogate as well as nursing, discussions with consultants, evaluation of patient's response to treatment, examination of patient, obtaining history from patient or surrogate, ordering and performing treatments and interventions, ordering and review  of laboratory studies, ordering and review of radiographic studies, pulse oximetry and re-evaluation of patient's condition.  Medications Ordered in ED Medications  cefTRIAXone (ROCEPHIN) 2 g in sodium chloride 0.9 % 100 mL IVPB (has no administration in time range)  acyclovir (ZOVIRAX) 770 mg in dextrose 5 % 150 mL IVPB (has no administration in time range)  lactated ringers infusion (has no administration in time range)  vancomycin (VANCOREADY) IVPB 2000 mg/400 mL (has no administration in time range)  sodium chloride flush (NS) 0.9 % injection 3 mL (3 mLs Intravenous Given 11/30/19 1249)  levETIRAcetam (KEPPRA) IVPB 1500 mg/ 100 mL premix (0 mg Intravenous Stopped 11/30/19 1315)    Followed by  levETIRAcetam (KEPPRA) IVPB 500 mg/100 mL premix (0 mg Intravenous Stopped 11/30/19 1315)  iohexol (OMNIPAQUE) 350 MG/ML injection 100 mL (100 mLs Intravenous Contrast Given 11/30/19 1317)  LORazepam (ATIVAN) injection 2 mg (2 mg Intravenous Given 11/30/19 1249)    ED Course  I have reviewed the triage vital signs and the nursing notes.  Pertinent labs & imaging results that were available during my care of the patient were reviewed by me and considered in my medical decision making (see chart for details).    MDM Rules/Calculators/A&P                         patient presented to the emergency department as a code stroke for right sided weakness. Patient evaluated by neurology team on ED arrival. She was not deemed to be a TPA candidate due to duration of symptoms. While in the CT scan or patient did have witnessed seizure activity that lasted less than one minute. She was treated with Ativan and Keppra.  Pt noted to be febrile on ED evaluation. She was treated with antibiotics for possible meningitis pending additional workup. Discussed with neurologist, recommends withholding steroids due to hyperglycemia and increased risk of bleeding in setting of acute stroke. LP attempted, unable to obtain CSF,  LP ordered under fluoroscopy. Medicine consulted for admission for further workup.  Additional history available from patient's husband after her initial assessment. He reports that she has been  feeling poorly for the last two days with the left temporal headache. Around 1 AM and she developed focal right sided seizures. He states that she is not been taking any of her medications for the last two days due to feeling unwell. She has not been vaccinated for COVID-19. No known sick contacts. No prior similar symptoms. She has no history of seizures. She does smoke tobacco. She does not use alcohol or drugs.  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

## 2019-11-30 NOTE — Code Documentation (Signed)
Stroke Response Nurse Documentation Code Documentation  Julia Medina is a 64 y.o. female arriving to [facility] ED via [facility] on 11/30/2019 with past medical hx of MI, CAD, HTN. Code stroke was activated by EMS. Patient from home where she was found with symptoms of AMS, R weakness, L gaze at 0100 by husband, LKW prior to that unclear. Patient reportedly had witnessed seizure activity at home, and had 2 seizures en route with EMS that were treated with 2.5mg  midazolam. On clopidogrel 75 mg daily PTA. Stroke team at the bedside on patient arrival. Labs drawn and patient cleared for CT by EDP Dr. Madilyn Hook. Patient to CT with team, patient with seizure activity while on CT table treated with 2mg  ativan and 2g keppra. NIHSS 14, see documentation for details and code stroke times. Patient with disoriented, left gaze preference , right arm weakness, right leg weakness, right decreased sensation, Expressive aphasia  and Sensory  neglect on exam. The following imaging was completed:  CT, CTA head and neck, CTP. Patient is not a candidate for tPA due to being outside of treatment window. Emergent MRI to determine eligibility for IR per Dr. . MRI completed and not a candidate for IR per Dr. Thomasena Edis. Care/Plan: q2h mNIHSS and medical work up for possible meningitis. Bedside handoff with ED RN Corliss Skains.    Magnus Ivan Etheridge Geil  Stroke Response RN

## 2019-11-30 NOTE — H&P (Signed)
History and Physical    Julia Medina HYW:[id number] DOB: 08-Aug-1955 DOA: 11/30/2019  PCP: Patient, No Pcp Per Consultants:  [facility] - cardiology Patient coming from:  Home - lives with husband and an adult who they care for; NOK: Husband, [phone]  Chief Complaint: R-sided weakness, seizure  HPI: Julia Medina is a 64 y.o. female with medical history significant of COPD; CAD with stents; and HTN presenting with R-sided weakness and seizure.  Her husband thinks she missed a couple of days of her medication (blood thinner and heart pills).  She wasn't feeling good, apparently.  Last night, she stopped eating.  He has never seen her have a seizure but she did overnight.  He saw her right arm and leg shaking.  It lasted about a minute and happened all last night, maybe every 15-20 minutes.  She was alert and awake for a minute and then would "go back out."  No fever.   ED Course:  Headache for a couple of days.  Last night with focal seizures.  Presented this afternoon with witnessed seizure in front of neurology.  Severe R-sided weakness, ?Todd's paralysis.  Failed LP, needs IR to tap.  Fever to 102, so meningitis is in the differential.  Negative MRI.  Given broad spectrum antibiotics.    Review of Systems: Unable to perform  Ambulatory Status:  Ambulates without assistance  COVID Vaccine Status:  None - refuses  Past Medical History:  Diagnosis Date  . CAD (coronary artery disease), native coronary artery    has stents  . COPD (chronic obstructive pulmonary disease) (HCC)   . Hypertension     Past Surgical History:  Procedure Laterality Date  . LUMBAR FUSION      Social History   Socioeconomic History  . Marital status: Legally Separated    Spouse name: Not on file  . Number of children: Not on file  . Years of education: Not on file  . Highest education level: Not on file  Occupational History  . Occupation: retired  Tobacco Use  . Smoking status: Current  Every Day Smoker    Packs/day: 2.00    Years: 45.00    Pack years: 90.00  . Smokeless tobacco: Never Used  Substance and Sexual Activity  . Alcohol use: Yes    Comment: rare  . Drug use: Never  . Sexual activity: Not on file  Other Topics Concern  . Not on file  Social History Narrative  . Not on file   Social Determinants of Health   Financial Resource Strain:   . Difficulty of Paying Living Expenses: Not on file  Food Insecurity:   . Worried About Programme researcher, broadcasting/film/video in the Last Year: Not on file  . Ran Out of Food in the Last Year: Not on file  Transportation Needs:   . Lack of Transportation (Medical): Not on file  . Lack of Transportation (Non-Medical): Not on file  Physical Activity:   . Days of Exercise per Week: Not on file  . Minutes of Exercise per Session: Not on file  Stress:   . Feeling of Stress : Not on file  Social Connections:   . Frequency of Communication with Friends and Family: Not on file  . Frequency of Social Gatherings with Friends and Family: Not on file  . Attends Religious Services: Not on file  . Active Member of Clubs or Organizations: Not on file  . Attends Banker Meetings: Not on file  .  Marital Status: Not on file  Intimate Partner Violence:   . Fear of Current or Ex-Partner: Not on file  . Emotionally Abused: Not on file  . Physically Abused: Not on file  . Sexually Abused: Not on file    Allergies  Allergen Reactions  . Aspirin Rash  . Penicillins Rash  . Sulfa Antibiotics Rash    Family History  Problem Relation Age of Onset  . Seizures Neg Hx     Prior to Admission medications   Medication Sig Start Date End Date Taking? Authorizing Provider  acetaminophen (TYLENOL) 650 MG CR tablet Take 650-1,300 mg by mouth every 8 (eight) hours as needed for pain.   Yes [provider]  albuterol (PROVENTIL HFA;VENTOLIN HFA) 108 (90 Base) MCG/ACT inhaler Inhale 2 puffs into the lungs every 4 (four) hours as  needed for wheezing or shortness of breath. 12/02/16  Yes Lawyer, Cristal Deer, PA-C  clopidogrel (PLAVIX) 75 MG tablet Take 75 mg by mouth daily. 06/21/19  Yes [provider]  furosemide (LASIX) 20 MG tablet Take 20 mg by mouth daily. 08/18/19  Yes [provider]  hydrochlorothiazide (HYDRODIURIL) 25 MG tablet Take 25 mg by mouth daily. 09/05/19  Yes [provider]  metoprolol tartrate (LOPRESSOR) 50 MG tablet Take 50 mg by mouth 2 (two) times daily. 06/20/19  Yes [provider]  omeprazole (PRILOSEC) 20 MG capsule Take 20 mg by mouth daily.   Yes [provider]  simvastatin (ZOCOR) 80 MG tablet Take 1 tablet by mouth at bedtime. 09/22/19  Yes [provider]  predniSONE (DELTASONE) 50 MG tablet Take 1 tablet (50 mg total) by mouth daily. Patient not taking: Reported on 11/30/2019 12/02/16   Charlestine Night, PA-C  tiotropium (SPIRIVA HANDIHALER) 18 MCG inhalation capsule Place 1 capsule (18 mcg total) into inhaler and inhale daily. Patient not taking: Reported on 11/30/2019 12/02/16   Charlestine Night, PA-C    Physical Exam: Vitals:   11/30/19 1320 11/30/19 1330  BP: (!) 170/89 (!) 148/85  Pulse: (!) 112   Resp:  20  Temp: (!) 101.8 F (38.8 C)   TempSrc: Oral   SpO2: 96%      . General:  Obtunded, unresponsive after Ativan . Eyes:   normal lids, iris . ENT:  grossly normal hearing, lips & tongue, mmm . Neck:  no LAD, masses or thyromegaly . Cardiovascular:  RR with tachycardia, no m/r/g. No LE edema.  Marland Kitchen Respiratory:   CTA bilaterally with no wheezes/rales/rhonchi.  Normal respiratory effort. . Abdomen:  soft, NT, ND, NABS . Skin:  no rash or induration seen on limited exam . Musculoskeletal:  grossly normal tone BUE/BLE, no bony abnormality . Psychiatric:  Obtunded after Ativan, no apparent seizure activity . Neurologic:  Unable to perform    Radiological Exams on Admission: MR BRAIN WO CONTRAST  Result Date:  11/30/2019 CLINICAL DATA:  64 year old female code stroke presentation, with seizure in the CT suite. CTA suspicious for left MCA ELVO, but negative CT Perfusion and evidence of underlying intracranial atherosclerosis. EXAM: MRI HEAD WITHOUT CONTRAST TECHNIQUE: Multiplanar, multiecho pulse sequences of the brain and surrounding structures were obtained without intravenous contrast. COMPARISON:  CT head, CTP and CTA today. FINDINGS: Study limited to DWI, axial FLAIR and T2* imaging at the request of neurology. Diffusion-weighted imaging is mildly motion degraded, but both axial and coronal DWI appear negative for restricted diffusion or acute infarct. Also, no hippocampal formation diffusion abnormality is identified. Motion degraded but otherwise unremarkable FLAIR  imaging. T2* also appears negative. No intracranial mass effect. No ventriculomegaly. Initially intracranial MRA was also planned but the examination had to be discontinued prior to completion. IMPRESSION: No evidence of acute ischemia. This study was discussed both with NIR, and also Dr. Marisue Humble at 1420 and 1430 hours respectively. We agreed the constellation favors intracranial atherosclerosis over emergent large vessel occlusion. And although there are no DWI findings of status epilepticus, sequelae of seizure is still possible. Electronically Signed   By: Odessa Fleming M.D.   On: 11/30/2019 14:51   CT CEREBRAL PERFUSION W CONTRAST  Result Date: 11/30/2019 CLINICAL DATA:  64 year old female with right side hemiparesis and leftward gaze. Had a seizure in the CT suite. Last known well 0130 hours. Negative plain head CT at 1257 hours today. EXAM: CT ANGIOGRAPHY HEAD AND NECK CT PERFUSION BRAIN TECHNIQUE: Multidetector CT imaging of the head and neck was performed using the standard protocol during bolus administration of intravenous contrast. Multiplanar CT image reconstructions and MIPs were obtained to evaluate the vascular anatomy. Carotid  stenosis measurements (when applicable) are obtained utilizing NASCET criteria, using the distal internal carotid diameter as the denominator. Multiphase CT imaging of the brain was performed following IV bolus contrast injection. Subsequent parametric perfusion maps were calculated using RAPID software. CONTRAST:  OMNIPAQUE IOHEXOL 350 MG/ML SOLN COMPARISON:  Head CT 1257 hours today. FINDINGS: CT Brain Perfusion Findings: ASPECTS: 10 CBF (<30%) Volume: 0mL Perfusion (Tmax>6.0s) volume: 0mL Mismatch Volume: Not applicable Infarction Location:No infarct core detected. No CBF or CBV abnormality. There is 79 mL of T-max greater than 4 seconds which is mostly but not completely in the posterior left MCA territory. CTA NECK Skeleton: Absent maxillary dentition. No acute osseous abnormality identified. Upper chest: Negative. Other neck: Mild thyroid enlargement. No discrete thyroid nodule. Upper limits of normal to mildly enlarged bilateral level 1B/2A lymph nodes, up to 9 mm short axis and slightly greater on the left. No cystic or necrotic nodes are evident. Other neck soft tissue contours are within normal limits. Aortic arch: Aberrant origin of the right subclavian artery, with shared origin of the CCA is. Three vessel arch configuration. Mild arch atherosclerosis. Right carotid system: Mildly tortuous proximal right CCA. No stenosis proximal to the bifurcation. Minimal plaque at the bifurcation. No cervical right ICA stenosis. Left carotid system: Mildly tortuous proximal left CCA with no plaque or stenosis. Mild soft and calcified plaque at the lateral left ICA origin without stenosis to the skull base. 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 Normal right vertebral artery origin. Mildly tortuous right V1 segment. Mildly non dominant right vertebral artery. Tortuosity at the C3 level. Patent right vertebral to the skull base without  plaque or stenosis. Minimal plaque in the proximal left subclavian artery without stenosis. Normal left vertebral artery origin. Mildly dominant and tortuous left vertebral artery is patent to the skull base without plaque or stenosis. CTA HEAD Suboptimal intracranial IV contrast bolus. Posterior circulation: Normal V4 segments, the left is mildly dominant. Normal left PICA origin. The right AICA appears dominant and patent. Patent vertebrobasilar junction and basilar artery without stenosis. Patent SCA and PCA origins. Posterior communicating arteries are diminutive or absent. Both PCA P1 and P2 segments are patent. Both PCA bifurcations appear patent but there is asymmetrically decreased distal left PCA flow on the left (series 10, image 23). No discrete PCA branch enhancement is evident. Anterior circulation: Both ICA siphons are patent. On  the right there is moderate siphon calcified plaque and moderate to severe stenosis just distal to the anterior genu (series 6, image 90). The right ICA terminus remains patent. On the left heavily calcified distal cavernous and supraclinoid ICA also, with moderate to severe supraclinoid stenosis as well (series 5, image 88). Patent left carotid terminus.  Patent MCA and ACA origins. Diminutive and irregular ACA A1 appearance a especially on the right where moderate to severe stenosis is noted on series 9, image 21. But no ACA branch occlusion is identified. 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 No right M2 occlusion is identified. On the left side the M1 is patent to the bifurcation and tortuous (series 8, image 18). At the bifurcation there are asymmetrically fewer enhancing left MCA branches suggesting 1 or more M2 occlusions (see series 10, image 27 versus series 10, image 13 on the right). And M2 occlusion is suspected on the source images series 7, image 117.  But other MCA branch detail is limited. Venous sinuses: Not evaluated due to early/suboptimal contrast. Anatomic variants: Aberrant origin right subclavian artery. Shared CCA origin. Review of the MIP images confirms the above findings IMPRESSION: 1. 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 2. Furthermore, the intracranial contrast bolus is suboptimal and there is intracranial atherosclerosis, including up to Severe bilateral Supraclinoid ICA stenoses. 3. There is also asymmetric decreased enhancement of the distal left PCA branches. 4. 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 5. But minimal atherosclerosis and no arterial stenosis in the neck. 6. Mild nonspecific level 2 cervical lymphadenopathy. [facility] and CTP findings reviewed in person with Dr. Thomasena Edis and PA DAVID [facility] on 11/30/2019 at 1323 hours. And in discussion that included NIR, decision was made for follow-up stat Brain MRI (to include at least DWI and FLAIR) and intracranial MRA to augment the decision making in this case. Electronically Signed   By: Odessa Fleming M.D.   On: 11/30/2019 13:40   DG Chest Port 1 View  Result Date: 11/30/2019 CLINICAL DATA:  Altered mental status. EXAM: PORTABLE CHEST 1 VIEW COMPARISON:  December 02, 2016. FINDINGS: Stable cardiomediastinal silhouette. Bilateral interstitial opacities are noted which may represent chronic scarring or interstitial lung disease. Acute superimposed edema or inflammation cannot be excluded. No pneumothorax or pleural effusion is noted. Bony thorax is unremarkable. IMPRESSION: Bilateral interstitial opacities are noted which may represent chronic scarring or interstitial lung disease. Acute superimposed edema or inflammation cannot be excluded. Electronically Signed   By: Lupita Raider M.D.   On: 11/30/2019 13:45   CT HEAD CODE STROKE WO CONTRAST  Result Date: 11/30/2019 CLINICAL  DATA:  Code stroke.  Acute neuro deficit.  Left-sided gaze. EXAM: CT HEAD WITHOUT CONTRAST TECHNIQUE: Contiguous axial images were obtained from the base of the skull through the vertex without intravenous contrast. COMPARISON:  None. FINDINGS: Brain: No evidence of acute infarction, hemorrhage, hydrocephalus, extra-axial collection or mass lesion/mass effect. Motion degraded study. Vascular: Negative for hyperdense vessel. Skull: Negative Sinuses/Orbits: Paranasal sinuses clear.  Negative orbit. Other: None ASPECTS (Alberta Stroke Program Early CT Score) - Ganglionic level infarction (caudate, lentiform nuclei, internal capsule, insula, M1-M3 cortex): 7 - Supraganglionic infarction (M4-M6 cortex): 3 Total score (0-10 with 10 being normal): 10 IMPRESSION: 1. Negative CT head 2. ASPECTS is  10 3. Code stroke imaging results were communicated on 11/30/2019 at 12:58 pm to provider [facility] via secure text paging. Electronically Signed   By: Marlan Palau M.D.   On: 11/30/2019 12:59   CT ANGIO HEAD CODE STROKE  Result Date: 11/30/2019 CLINICAL DATA:  64 year old female with right side hemiparesis and leftward gaze. Had a seizure in the CT suite. Last known well 0130 hours. Negative plain head CT at 1257 hours today. EXAM: CT ANGIOGRAPHY HEAD AND NECK CT PERFUSION BRAIN TECHNIQUE: Multidetector CT imaging of the head and neck was performed using the standard protocol during bolus administration of intravenous contrast. Multiplanar CT image reconstructions and MIPs were obtained to evaluate the vascular anatomy. Carotid stenosis measurements (when applicable) are obtained utilizing NASCET criteria, using the distal internal carotid diameter as the denominator. Multiphase CT imaging of the brain was performed following IV bolus contrast injection. Subsequent parametric perfusion maps were calculated using RAPID software. CONTRAST:  OMNIPAQUE IOHEXOL 350 MG/ML SOLN COMPARISON:  Head CT 1257 hours today. FINDINGS: CT  Brain Perfusion Findings: ASPECTS: 10 CBF (<30%) Volume: 0mL Perfusion (Tmax>6.0s) volume: 0mL Mismatch Volume: Not applicable Infarction Location:No infarct core detected. No CBF or CBV abnormality. There is 79 mL of T-max greater than 4 seconds which is mostly but not completely in the posterior left MCA territory. CTA NECK Skeleton: Absent maxillary dentition. No acute osseous abnormality identified. Upper chest: Negative. Other neck: Mild thyroid enlargement. No discrete thyroid nodule. Upper limits of normal to mildly enlarged bilateral level 1B/2A lymph nodes, up to 9 mm short axis and slightly greater on the left. No cystic or necrotic nodes are evident. Other neck soft tissue contours are within normal limits. Aortic arch: Aberrant origin of the right subclavian artery, with shared origin of the CCA is. Three vessel arch configuration. Mild arch atherosclerosis. Right carotid system: Mildly tortuous proximal right CCA. No stenosis proximal to the bifurcation. Minimal plaque at the bifurcation. No cervical right ICA stenosis. Left carotid system: Mildly tortuous proximal left CCA with no plaque or stenosis. Mild soft and calcified plaque at the lateral left ICA origin without stenosis to the skull base. 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 Normal right vertebral artery origin. Mildly tortuous right V1 segment. Mildly non dominant right vertebral artery. Tortuosity at the C3 level. Patent right vertebral to the skull base without plaque or stenosis. Minimal plaque in the proximal left subclavian artery without stenosis. Normal left vertebral artery origin. Mildly dominant and tortuous left vertebral artery is patent to the skull base without plaque or stenosis. CTA HEAD Suboptimal intracranial IV contrast bolus. Posterior circulation: Normal V4 segments, the left is mildly dominant. Normal left PICA origin. The right AICA appears  dominant and patent. Patent vertebrobasilar junction and basilar artery without stenosis. Patent SCA and PCA origins. Posterior communicating arteries are diminutive or absent. Both PCA P1 and P2 segments are patent. Both PCA bifurcations appear patent but there is asymmetrically decreased distal left PCA flow on the left (series 10, image 23). No discrete PCA branch enhancement is evident. Anterior circulation: Both ICA siphons are patent. On the right there is moderate siphon calcified plaque and moderate to severe stenosis just distal to the anterior genu (series 6, image 90). The right ICA terminus remains patent. On the left heavily calcified distal cavernous and supraclinoid ICA also, with moderate to severe supraclinoid stenosis as well (series 5, image 88). Patent left carotid terminus.  Patent MCA  and ACA origins. Diminutive and irregular ACA A1 appearance a especially on the right where moderate to severe stenosis is noted on series 9, image 21. But no ACA branch occlusion is identified. 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 No right M2 occlusion is identified. On the left side the M1 is patent to the bifurcation and tortuous (series 8, image 18). At the bifurcation there are asymmetrically fewer enhancing left MCA branches suggesting 1 or more M2 occlusions (see series 10, image 27 versus series 10, image 13 on the right). And M2 occlusion is suspected on the source images series 7, image 117. But other MCA branch detail is limited. Venous sinuses: Not evaluated due to early/suboptimal contrast. Anatomic variants: Aberrant origin right subclavian artery. Shared CCA origin. Review of the MIP images confirms the above findings IMPRESSION: 1. 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 2. Furthermore, the intracranial  contrast bolus is suboptimal and there is intracranial atherosclerosis, including up to Severe bilateral Supraclinoid ICA stenoses. 3. There is also asymmetric decreased enhancement of the distal left PCA branches. 4. 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 5. But minimal atherosclerosis and no arterial stenosis in the neck. 6. Mild nonspecific level 2 cervical lymphadenopathy. Salient Vascular and CTP findings reviewed in person with Dr. Thomasena Edisollins and PA [facility] on 11/30/2019 at 1323 hours. And in discussion that included NIR, decision was made for follow-up stat Brain MRI (to include at least DWI and FLAIR) and intracranial MRA to augment the decision making in this case. Electronically Signed   By: Odessa FlemingH  Hall M.D.   On: 11/30/2019 13:40   CT ANGIO NECK CODE STROKE  Result Date: 11/30/2019 CLINICAL DATA:  64 year old female with right side hemiparesis and leftward gaze. Had a seizure in the CT suite. Last known well 0130 hours. Negative plain head CT at 1257 hours today. EXAM: CT ANGIOGRAPHY HEAD AND NECK CT PERFUSION BRAIN TECHNIQUE: Multidetector CT imaging of the head and neck was performed using the standard protocol during bolus administration of intravenous contrast. Multiplanar CT image reconstructions and MIPs were obtained to evaluate the vascular anatomy. Carotid stenosis measurements (when applicable) are obtained utilizing NASCET criteria, using the distal internal carotid diameter as the denominator. Multiphase CT imaging of the brain was performed following IV bolus contrast injection. Subsequent parametric perfusion maps were calculated using RAPID software. CONTRAST:  100mL OMNIPAQUE IOHEXOL 350 MG/ML SOLN COMPARISON:  Head CT 1257 hours today. FINDINGS: CT Brain Perfusion Findings: ASPECTS: 10 CBF (<30%) Volume: 0mL Perfusion (Tmax>6.0s) volume: 0mL Mismatch Volume: Not applicable Infarction Location:No infarct core detected. No CBF or CBV abnormality. There is 79  mL of T-max greater than 4 seconds which is mostly but not completely in the posterior left MCA territory. CTA NECK Skeleton: Absent maxillary dentition. No acute osseous abnormality identified. Upper chest: Negative. Other neck: Mild thyroid enlargement. No discrete thyroid nodule. Upper limits of normal to mildly enlarged bilateral level 1B/2A lymph nodes, up to 9 mm short axis and slightly greater on the left. No cystic or necrotic nodes are evident. Other neck soft tissue contours are within normal limits. Aortic arch: Aberrant origin of the right subclavian artery, with shared origin of the CCA is. Three vessel arch configuration. Mild arch atherosclerosis. Right carotid system: Mildly tortuous proximal right CCA.  No stenosis proximal to the bifurcation. Minimal plaque at the bifurcation. No cervical right ICA stenosis. Left carotid system: Mildly tortuous proximal left CCA with no plaque or stenosis. Mild soft and calcified plaque at the lateral left ICA origin without stenosis to the skull base. 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 Normal right vertebral artery origin. Mildly tortuous right V1 segment. Mildly non dominant right vertebral artery. Tortuosity at the C3 level. Patent right vertebral to the skull base without plaque or stenosis. Minimal plaque in the proximal left subclavian artery without stenosis. Normal left vertebral artery origin. Mildly dominant and tortuous left vertebral artery is patent to the skull base without plaque or stenosis. CTA HEAD Suboptimal intracranial IV contrast bolus. Posterior circulation: Normal V4 segments, the left is mildly dominant. Normal left PICA origin. The right AICA appears dominant and patent. Patent vertebrobasilar junction and basilar artery without stenosis. Patent SCA and PCA origins. Posterior communicating arteries are diminutive or absent. Both PCA P1 and P2 segments are patent.  Both PCA bifurcations appear patent but there is asymmetrically decreased distal left PCA flow on the left (series 10, image 23). No discrete PCA branch enhancement is evident. Anterior circulation: Both ICA siphons are patent. On the right there is moderate siphon calcified plaque and moderate to severe stenosis just distal to the anterior genu (series 6, image 90). The right ICA terminus remains patent. On the left heavily calcified distal cavernous and supraclinoid ICA also, with moderate to severe supraclinoid stenosis as well (series 5, image 88). Patent left carotid terminus.  Patent MCA and ACA origins. Diminutive and irregular ACA A1 appearance a especially on the right where moderate to severe stenosis is noted on series 9, image 21. But no ACA branch occlusion is identified. 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 No right M2 occlusion is identified. On the left side the M1 is patent to the bifurcation and tortuous (series 8, image 18). At the bifurcation there are asymmetrically fewer enhancing left MCA branches suggesting 1 or more M2 occlusions (see series 10, image 27 versus series 10, image 13 on the right). And M2 occlusion is suspected on the source images series 7, image 117. But other MCA branch detail is limited. Venous sinuses: Not evaluated due to early/suboptimal contrast. Anatomic variants: Aberrant origin right subclavian artery. Shared CCA origin. Review of the MIP images confirms the above findings IMPRESSION: 1. 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 2. Furthermore, the intracranial contrast bolus is suboptimal and there is intracranial atherosclerosis, including up to Severe bilateral Supraclinoid ICA stenoses. 3. There is also asymmetric decreased enhancement of the distal left PCA branches. 4.  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 5. But minimal atherosclerosis and no arterial stenosis in the neck. 6. Mild nonspecific level 2 cervical lymphadenopathy. [facility] and CTP findings reviewed in person with Dr. Thomasena Edis and PA [facility] on 11/30/2019 at 1323 hours. And in discussion that included NIR, decision was made for follow-up stat Brain MRI (to include at least DWI and FLAIR) and intracranial MRA to augment the decision making in this case. Electronically Signed   By: Odessa Fleming M.D.   On: 11/30/2019 13:40    EKG: Independently reviewed.  Sinus tachycardia with  rate 108; prolonged QTc 504; nonspecific ST changes with no evidence of acute ischemia   Labs on Admission: I have personally reviewed the available labs and imaging studies at the time of the admission.  Pertinent labs:   CO2 18 Glucose 337 Anion gap 17 WBC 16.6 INR 1.1 COVID/flu negative Lactate 2.3   Assessment/Plan Principal Problem:   Acute metabolic encephalopathy Active Problems:   Hypertension   COPD (chronic obstructive pulmonary disease) (HCC)   CAD (coronary artery disease), native coronary artery   Sepsis (HCC)   Acute metabolic encephalopathy -Patient presenting with several days of possible seizure activity, AMS -Here in the ER was found to be febrile -Most likely current DDx includes seizure and/or meningitis/infection (see below) -Negative head CT -Head/neck CTA concerning for LVO but MRI appears to make this less likely - although there is intracranial atherosclerosis -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 -While awaiting blood cultures, this appears to be a preseptic condition. -Sepsis protocol initiated -Suspected source is meningitis -Blood and urine cultures pending -LP is pending; EDP was unable to perform and so this will need to be done by IR -Treat with IV Rocephin/Vanc/Acyclovir/Decadron for undifferentiated sepsis with concern for meningitis -Will add HIV -Will trend lactate to ensure improvement -Will order procalcitonin level.  Antibiotics would not be indicated for PCT <0.1 and probably should not be used for < 0.25.  >0.5 indicates infection and >>0.5 indicates more serious disease.  As the procalcitonin level normalizes, it will be reasonable to consider de-escalation of antibiotic coverage.  CAD -Cath in 06/2017 with stent placement -Will need to resume Plavix when able to safely take PO  COPD -Continue albuterol prn  HTN -Hold HCTZ and Lopressor while NPO -Will cover with prn IV Hydralazine  HLD -Will need to resume Zocor when no longer NPO    Note: This patient has been tested and is negative for the novel coronavirus COVID-19. She has NOT been vaccinated against COVID-19.    DVT prophylaxis:  Lovenox Code Status:  Full - confirmed with family Family Communication: Husband present throughout evaluation  Disposition Plan:  The patient is from: home  Anticipated d/c is to: home without [facility] services   Anticipated d/c date will depend on clinical response to treatment, likely several days  Patient is currently: acutely ill Consults called: Neurology; IR Admission status: Admit - It is my clinical opinion that admission to INPATIENT is reasonable and necessary because of the expectation that this patient will  require hospital care that crosses at least 2 midnights to treat this condition based on the medical complexity of the problems presented.  Given the aforementioned information, the predictability of an adverse outcome is felt to be significant.     Jonah Blue MD [facility]   How to contact the [facility] Attending or Consulting provider 7A - 7P or covering provider during after hours 7P -7A, for this patient?  1. Check the care team in [facility] and look for  a) attending/consulting TR[facility] provider listed and b) the [facility] team listed 2. Log into [email or web address] and use [facility] universal password to access. If you do not have the password, please contact the hospital operator. 3. Locate the [facility] provider you are looking for under [facility] and page to a number that you can be directly reached. 4. If you still have difficulty reaching the provider, please page the [facility] (Director on Call) for the Hospitalists listed on amion for assistance.   11/30/2019, 4:24 PM

## 2019-11-30 NOTE — Consult Note (Signed)
Consult H&P  CC: left gaze deviation and right sided weakness  History is obtained from: Chart review, EMS  HPI: Julia Medina is a 64 y.o. female MI, CAD, HTN with symptoms of AMS, right weakness and left gaze at 0100 noticed by her husband. EMS was notified and the patient had seizure en route and given lorazepam. On arrival she was nonverbal with left gaze deviation and right sided weakness. She was taken to the scanner and had another focal seizure with convulsions of the left arm > leg. She was given lorazepam and loaded with LEV 2g.   LKW: 0100 on 11/30/2019 tpa given?: No, outside the window IR Thrombectomy? No, not a candidate for thrombectomy Functional staus Unknown NIHSS: 14  ROS: A complete ROS was performed and is negative except as noted in the HPI.   Past Medical History:  Diagnosis Date  . COPD (chronic obstructive pulmonary disease) (HCC)     No family history on file.  Social History:  has no history on file for tobacco use, alcohol use, and drug use.  Prior to Admission medications   Medication Sig Start Date End Date Taking? Authorizing Provider  acetaminophen (TYLENOL) 650 MG CR tablet Take 650-1,300 mg by mouth every 8 (eight) hours as needed for pain.   Yes [provider]  albuterol (PROVENTIL HFA;VENTOLIN HFA) 108 (90 Base) MCG/ACT inhaler Inhale 2 puffs into the lungs every 4 (four) hours as needed for wheezing or shortness of breath. 12/02/16  Yes Lawyer, Cristal Deer, PA-C  clopidogrel (PLAVIX) 75 MG tablet Take 75 mg by mouth daily. 06/21/19  Yes [provider]  furosemide (LASIX) 20 MG tablet Take 20 mg by mouth daily. 08/18/19  Yes [provider]  hydrochlorothiazide (HYDRODIURIL) 25 MG tablet Take 25 mg by mouth daily. 09/05/19  Yes [provider]  metoprolol tartrate (LOPRESSOR) 50 MG tablet Take 50 mg by mouth 2 (two) times daily. 06/20/19  Yes [provider]  omeprazole (PRILOSEC) 20 MG capsule Take 20  mg by mouth daily.   Yes [provider]  simvastatin (ZOCOR) 80 MG tablet Take 1 tablet by mouth at bedtime. 09/22/19  Yes [provider]  predniSONE (DELTASONE) 50 MG tablet Take 1 tablet (50 mg total) by mouth daily. Patient not taking: Reported on 11/30/2019 12/02/16   Charlestine Night, PA-C  tiotropium (SPIRIVA HANDIHALER) 18 MCG inhalation capsule Place 1 capsule (18 mcg total) into inhaler and inhale daily. Patient not taking: Reported on 11/30/2019 12/02/16   Charlestine Night, PA-C    Exam: Current vital signs: BP (!) 148/85   Pulse (!) 112   Temp (!) 101.8 F (38.8 C) (Oral)   Resp 20   SpO2 96%    Physical Exam  Constitutional: Appears well-developed and well-nourished.  Psych: Affect appropriate to situation Eyes: No scleral injection HENT: No OP obstrucion Head: Normocephalic.  Cardiovascular: Normal rate and regular rhythm.  Respiratory: Effort normal and breath sounds normal to anterior ascultation GI: Soft.  No distension. There is no tenderness.  Skin: WDI  Neuro: Mental Status: Patient is awake and nonverbal Cranial Nerves: Visual Fields possible right hemianopsia. Pupils are equal, round, and reactive to light. Forced left gaze deviation. EOMI without ptosis or diploplia.  Facial movement is symmetric.  hearing is intact to voice Uvula elevates symmetrically Tongue is midline without atrophy or fasciculations.  Motor: Tone is normal. Bulk is normal. Right side is flaccid. Sensory:  Withdraws to pain Deep Tendon Reflexes: 2+ and symmetric in the  biceps and patellae.  Plantars: Toes are downgoing bilaterally.  Cerebellar: Unable to assess due to her condition.  I have reviewed the images obtained:NCT head did not show acute stroke with ASPECTS 10. CTA and CTP showed left MCA M2 ELVO, but no core infarct or penumbra.  Impression: 64 year old woman with flaccid right side and left gaze deviation. Her imaging did not reveal acute  stroke however the patient has significant intracranial atherosclerotic disease. The possible left M2 occlusion warranted MRI which did not show acute stroke. Discussed with neurointervention and patient is not a candidate for intervention as the occlusion may likely have been cerebrovascular disease.  *the patient spiked a fever and there was suspicion for meningitis.  - Recommend TTE - Recommend ordering Lipid panel - Recommend Statin if LDL > 70 - Recommend obtaining HbA1c - continue clopidogrel. - SBP goal - permissive hypertension first 24 h < 220/110. Hold home meds.  - Telemetry monitoring for arrythmia - Recommend bedside Swallow screen - Recommend Stroke educatio - Recommend PT/OT/SLP consult - Recommend LTM to monitor for possible status. - Recommend metabolic/infectious workup with CBC, CMP, UA with UCx, CXR, serum lactate. - Empiric vancomycin, ceftriaxone and acyclovir.  Discussed plan with ED.  This patient is critically ill and at significant risk of neurological worsening, death and care requires constant monitoring of vital signs, hemodynamics,respiratory and cardiac monitoring, neurological assessment, discussion with family, other specialists and medical decision making of high complexity. I spent 75 minutes of neurocritical care time  in the care of  this patient. This was time spent independent of any time provided by nurse practitioner or PA.  Electronically signed by: Dr. Marisue Humble Pager: [phone]

## 2019-12-01 ENCOUNTER — Inpatient Hospital Stay (HOSPITAL_COMMUNITY): Payer: PRIVATE HEALTH INSURANCE

## 2019-12-01 DIAGNOSIS — G9341 Metabolic encephalopathy: Secondary | ICD-10-CM

## 2019-12-01 DIAGNOSIS — I6389 Other cerebral infarction: Secondary | ICD-10-CM

## 2019-12-01 LAB — VDRL, CSF: VDRL Quant, CSF: NONREACTIVE

## 2019-12-01 LAB — CBC
HCT: 49.3 % — ABNORMAL HIGH (ref 36.0–46.0)
Hemoglobin: 16.4 g/dL — ABNORMAL HIGH (ref 12.0–15.0)
MCH: 30.1 pg (ref 26.0–34.0)
MCHC: 33.3 g/dL (ref 30.0–36.0)
MCV: 90.6 fL (ref 80.0–100.0)
Platelets: 297 10*3/uL (ref 150–400)
RBC: 5.44 MIL/uL — ABNORMAL HIGH (ref 3.87–5.11)
RDW: 14.6 % (ref 11.5–15.5)
WBC: 18.2 10*3/uL — ABNORMAL HIGH (ref 4.0–10.5)
nRBC: 0 % (ref 0.0–0.2)

## 2019-12-01 LAB — ECHOCARDIOGRAM COMPLETE
Area-P 1/2: 2.62 cm2
Height: 65 in
S' Lateral: 3.03 cm
Weight: 3792 oz

## 2019-12-01 LAB — COMPREHENSIVE METABOLIC PANEL
ALT: 18 U/L (ref 0–44)
AST: 22 U/L (ref 15–41)
Albumin: 3.3 g/dL — ABNORMAL LOW (ref 3.5–5.0)
Alkaline Phosphatase: 92 U/L (ref 38–126)
Anion gap: 14 (ref 5–15)
BUN: 19 mg/dL (ref 8–23)
CO2: 24 mmol/L (ref 22–32)
Calcium: 9.9 mg/dL (ref 8.9–10.3)
Chloride: 108 mmol/L (ref 98–111)
Creatinine, Ser: 0.79 mg/dL (ref 0.44–1.00)
GFR calc Af Amer: >60 mL/min (ref >60)
GFR calc non Af Amer: >60 mL/min (ref >60)
Glucose, Bld: 130 mg/dL — ABNORMAL HIGH (ref 70–99)
Potassium: 3.3 mmol/L — ABNORMAL LOW (ref 3.5–5.1)
Sodium: 146 mmol/L — ABNORMAL HIGH (ref 135–145)
Total Bilirubin: 1.3 mg/dL — ABNORMAL HIGH (ref 0.3–1.2)
Total Protein: 7.2 g/dL (ref 6.5–8.1)

## 2019-12-01 LAB — LIPID PANEL
Cholesterol: 180 mg/dL (ref 0–200)
HDL: 31 mg/dL — ABNORMAL LOW (ref >40)
LDL Cholesterol: 125 mg/dL — ABNORMAL HIGH (ref 0–99)
Total CHOL/HDL Ratio: 5.8 RATIO
Triglycerides: 122 mg/dL (ref <150)
VLDL: 24 mg/dL (ref 0–40)

## 2019-12-01 LAB — T4, FREE: Free T4: 0.92 ng/dL (ref 0.61–1.12)

## 2019-12-01 LAB — MRSA PCR SCREENING: MRSA by PCR: POSITIVE — AB

## 2019-12-01 LAB — GLUCOSE, CAPILLARY
Glucose-Capillary: 353 mg/dL — ABNORMAL HIGH (ref 70–99)
Glucose-Capillary: 307 mg/dL — ABNORMAL HIGH (ref 70–99)
Glucose-Capillary: 197 mg/dL — ABNORMAL HIGH (ref 70–99)
Glucose-Capillary: 176 mg/dL — ABNORMAL HIGH (ref 70–99)

## 2019-12-01 LAB — PROCALCITONIN: Procalcitonin: <0.1 ng/mL

## 2019-12-01 LAB — HIV ANTIBODY (ROUTINE TESTING W REFLEX): HIV Screen 4th Generation wRfx: NONREACTIVE

## 2019-12-01 LAB — CYTOLOGY - NON PAP

## 2019-12-01 LAB — MAGNESIUM: Magnesium: 2.1 mg/dL (ref 1.7–2.4)

## 2019-12-01 LAB — PHOSPHORUS: Phosphorus: 3.7 mg/dL (ref 2.5–4.6)

## 2019-12-01 LAB — TSH: TSH: 0.249 u[IU]/mL — ABNORMAL LOW (ref 0.350–4.500)

## 2019-12-01 IMAGING — MR MR MRA HEAD W/O CM
3 series · 19 of 48 positions shown · non-contrast
Comparison: Head and neck CTA [DATE]

CLINICAL DATA: Seizures. Initial concern for stroke with possible
left M2 occlusion on CTA.

EXAM:
MRA HEAD WITHOUT CONTRAST
TECHNIQUE: Angiographic images of the Circle of Willis were obtained using MRA
technique without intravenous contrast.

[Series 3: ax (id) · axial · 1.0mm · 0.43mm/px · z∈[-6,+74]mm · 16 of 184 slices shown]
[im 1/184]
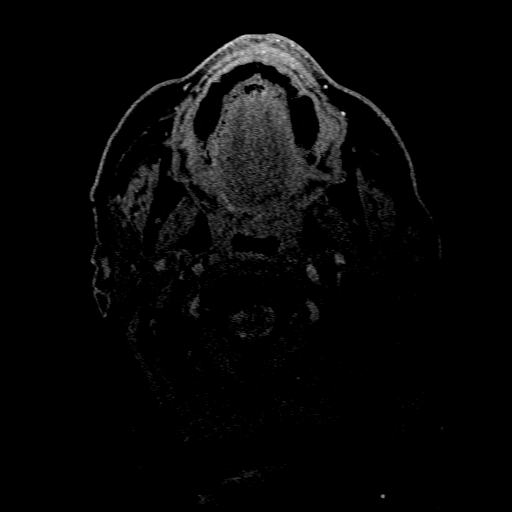
[im 5/184]
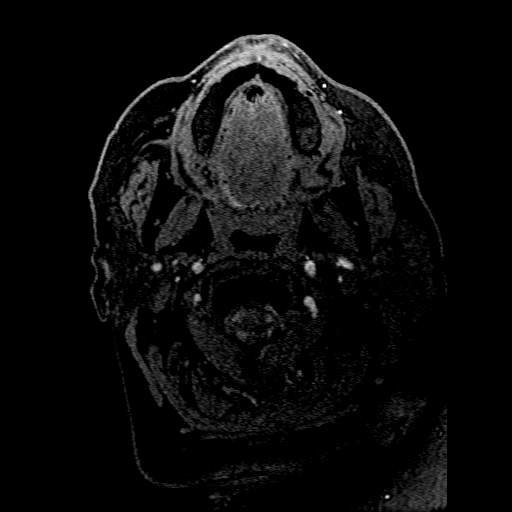
[im 9/184]
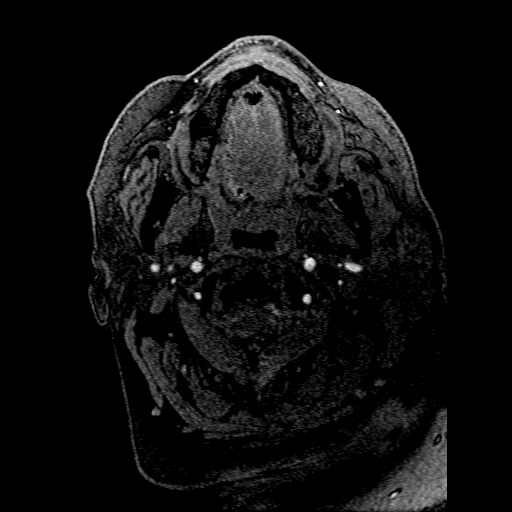
[im 13/184]
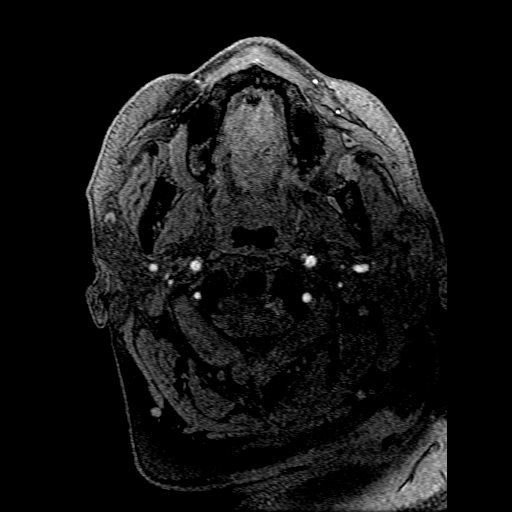
[im 17/184]
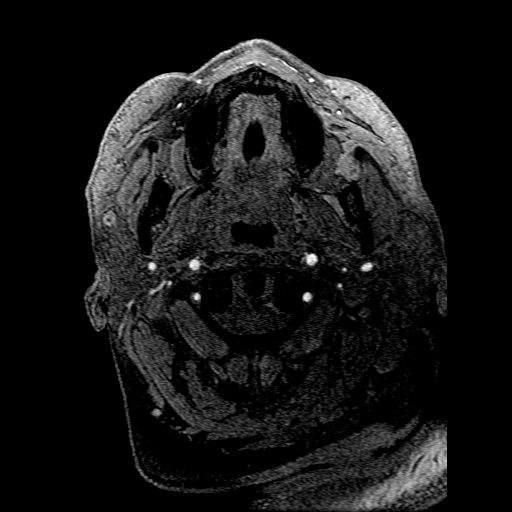
[im 21/184]
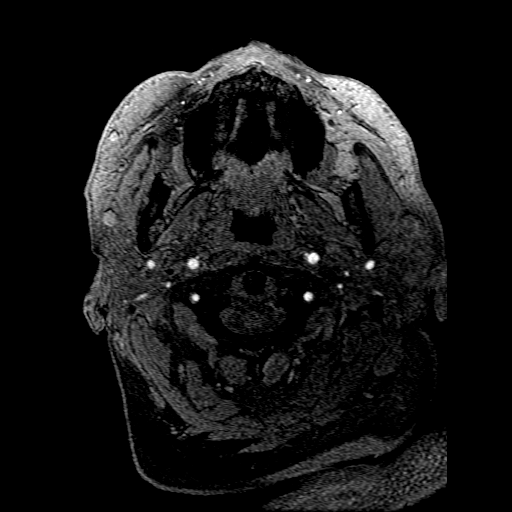
[im 30/184]
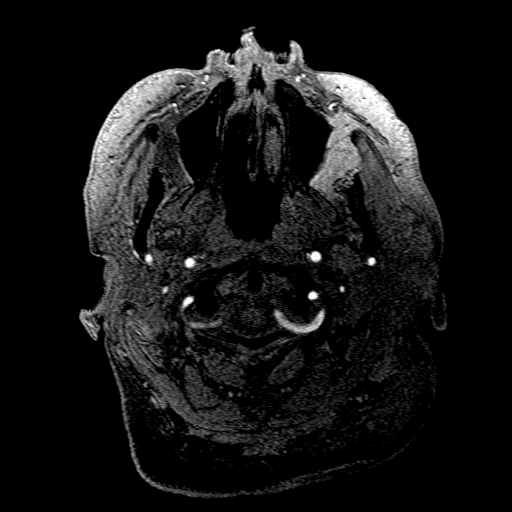
[im 34/184]
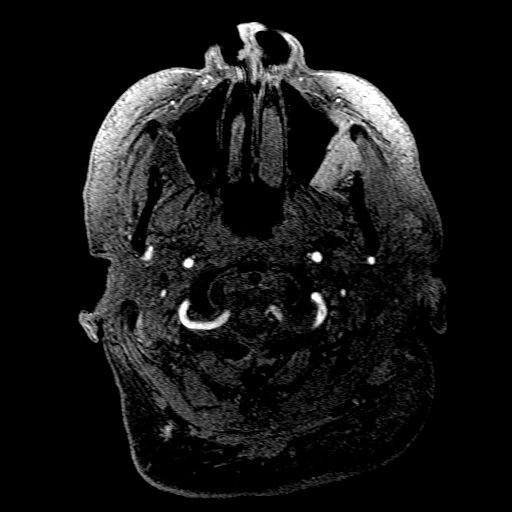
[im 59/184]
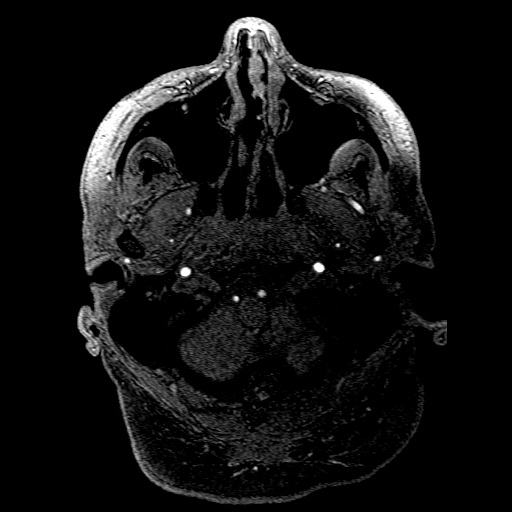
[im 80/184]
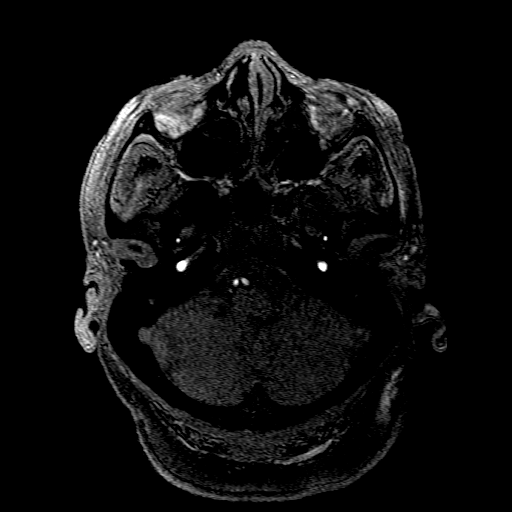
[im 92/184]
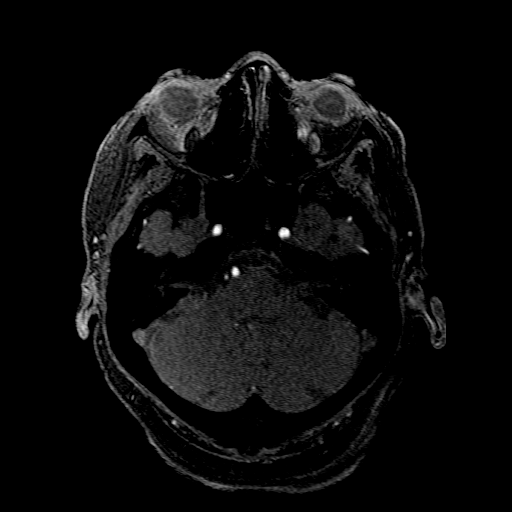
[im 104/184]
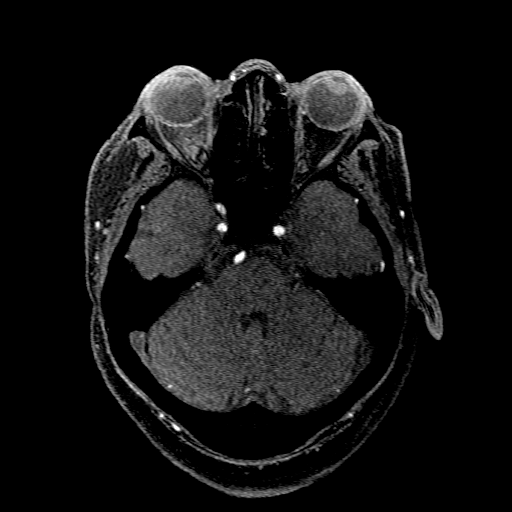
[im 125/184]
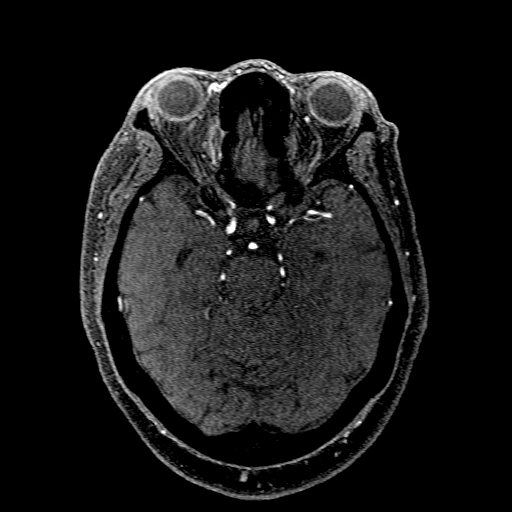
[im 150/184]
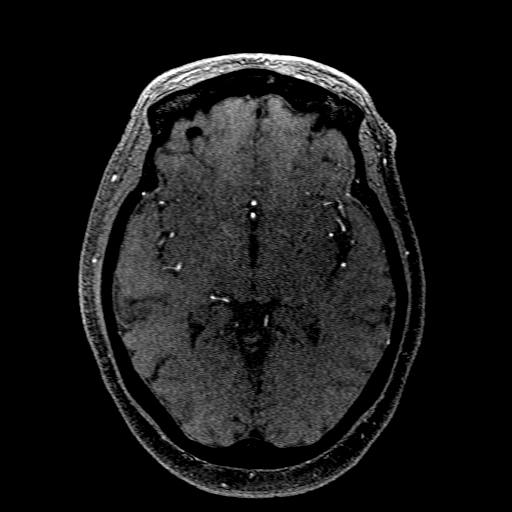
[im 154/184]
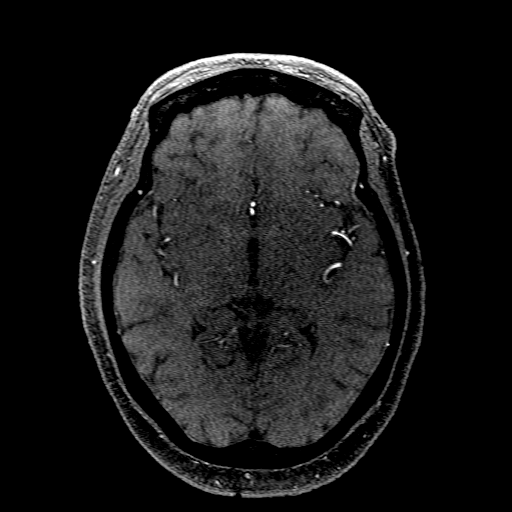
[im 175/184]
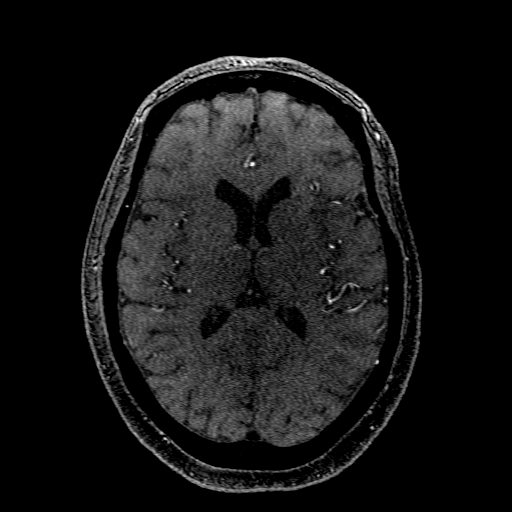

[Series 300: col:ax (id) · axial · 1.0mm · 0.43mm/px · 1 of 1 slices shown]
[im 1/1]
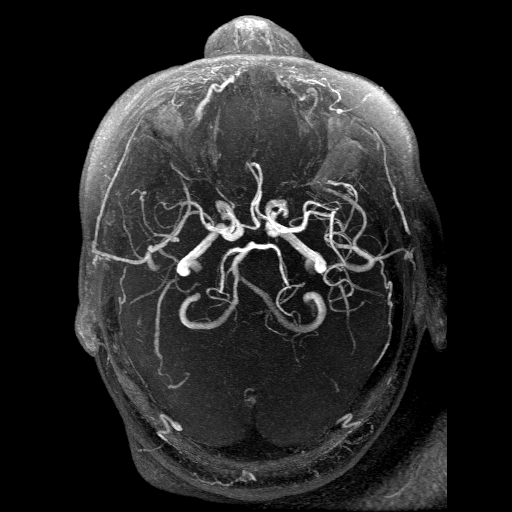

[Series 301: pjn:ax (id) · coronal · 1.0mm · 0.43mm/px · 2 of 6 slices shown]
[im 1/6]
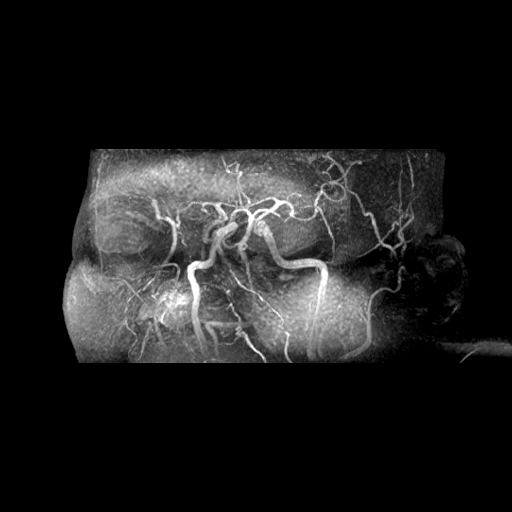
[im 6/6]
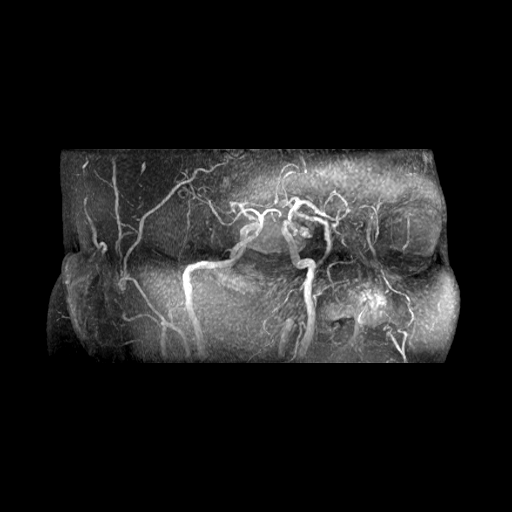

[19 of 48 positions shown; findings below may reference images not displayed]

FINDINGS: The visualized distal vertebral arteries are widely patent to the
basilar with the left being mildly dominant. The left PICA and right
AICA appear dominant. Patent SCAs are seen bilaterally. The basilar
artery is widely patent. There are small left and likely diminutive
right posterior communicating arteries. Both PCAs are patent with
mild atherosclerotic type irregularity but no significant proximal
stenosis.

The internal carotid arteries are patent from skull base to carotid
termini with moderate right and severe left cavernous segment
stenoses as well as moderate proximal left supraclinoid stenosis.
ACAs and MCAs are patent without evidence of a proximal branch
occlusion or significant proximal stenosis. A 3 mm aneurysm is again
noted at the right MCA bifurcation involving the inferior M2 origin.
IMPRESSION: 1. No large vessel occlusion.
2. Intracranial atherosclerosis including moderate right and severe
left ICA stenoses.
3. 3 mm right MCA bifurcation aneurysm.

## 2019-12-01 MED ORDER — MUPIROCIN 2 % EX OINT
1.0000 "application " | TOPICAL_OINTMENT | Freq: Two times a day (BID) | CUTANEOUS | Status: DC
  Administered 2019-12-01 – 2019-12-03 (×5): 1 via NASAL
  Filled 2019-12-01 (×2): qty 22

## 2019-12-01 MED ORDER — CHLORHEXIDINE GLUCONATE CLOTH 2 % EX PADS
6.0000 | MEDICATED_PAD | Freq: Every day | CUTANEOUS | Status: DC
  Administered 2019-12-01 – 2019-12-03 (×3): 6 via TOPICAL

## 2019-12-01 MED ORDER — INSULIN ASPART 100 UNIT/ML ~~LOC~~ SOLN
0.0000 [IU] | SUBCUTANEOUS | Status: DC
  Administered 2019-12-01: 4 [IU] via SUBCUTANEOUS
  Administered 2019-12-01: 11 [IU] via SUBCUTANEOUS
  Administered 2019-12-01: 3 [IU] via SUBCUTANEOUS
  Administered 2019-12-01: 11 [IU] via SUBCUTANEOUS
  Administered 2019-12-02: 5 [IU] via SUBCUTANEOUS
  Administered 2019-12-02: 8 [IU] via SUBCUTANEOUS
  Administered 2019-12-02: 11 [IU] via SUBCUTANEOUS
  Administered 2019-12-02: 5 [IU] via SUBCUTANEOUS
  Administered 2019-12-02: 11 [IU] via SUBCUTANEOUS
  Administered 2019-12-02 – 2019-12-03 (×4): 8 [IU] via SUBCUTANEOUS
  Administered 2019-12-03: 5 [IU] via SUBCUTANEOUS

## 2019-12-01 NOTE — Consult Note (Signed)
[facility]    Date of Admission:  11/30/2019   Total days of antibiotics 1        Day 1/1 Vancomycin - discontinued        Day 1/1 Rocephin - discontinued        Day 1 Acyclovir       Reason for Consult: Acute Encephalitis     Referring Provider: Dr. Maryfrances Bunnell MD Primary Care Provider: None per chart  Assessment:  Julia Medina is a 64 y/o F with a PMHx of CAD s/p PCI x 2, HTN, and COPD who presented to the ED on 11/30/19 for  right sided weakness and left sided headaches who subsequently had seizures x 2 and fever since being admitted.   Her overall clinical picture is consistent with infectious vs non-infectious neurological abnormalities. Upon evaluation of the workup performed thus far, the patient's white count was elevated at 16.9 with differential of elevated neutrophils at 12.8. Lactic acid of 2.3 initially that is resolved to 1.9. Blood cultures thus far are negative. CSF cultures/gram stain are negative, white blood cells seen with PMN's present. Fungal culture currently pending. HSV CSF pending, did not seen HIV ordered.   CSF fluid analysis revealed WBC of 26, 93 neutrophils. Glucose of 202 ( serum glucose of over 300 upon admission), protein of 41. RBC dropped from 155 to 6 from tube 1-4.   Initially patient was started on antibiotics of rocephin, vancomycin as well as acyclovir.   At this time, believe the patient's clinical picture is consistent with acute encephalitis vs aseptic meningitis. More suspicious for encephalitis at this time as patient's clinical picture, level of altered mental status, as well as laboratory data would, in my opinion, be more suggestive of an encephalitis over an aseptic meningitis. However, both are still in my differential.  As such, agree with discontinuing the antibiotics as do not believe there is a bacterial etiology to patient's current state.  Recommend continuing current course of acyclovir and will await  the results of the CSF HSV test and would also recommend HIV testing as well as arbovirus panel.    Plan: 1. Continue acyclovir at this time, agree with stopping vancomycin and rocephin.  2.  Await test results of HSV. Order HIV and arbovirus panel 3. Continue to monitor for acute changes  Principal Problem:   Acute metabolic encephalopathy Active Problems:   Hypertension   COPD (chronic obstructive pulmonary disease) (HCC)   CAD (coronary artery disease), native coronary artery   Sepsis (HCC)   Scheduled Meds: . docusate sodium  100 mg Oral BID  . enoxaparin (LOVENOX) injection  40 mg Subcutaneous Q24H  . insulin aspart  0-15 Units Subcutaneous Q4H  . lidocaine (PF)  2 mL Intradermal Once   Continuous Infusions: . acyclovir Stopped (12/01/19 0303)  . lactated ringers 100 mL/hr at 12/01/19 0029   HPI: Julia Medina is a 64 y.o. female with a PMHx of CAD s/p PCI x 2, COPD, HTN who was brought into the ED by EMS on 11/30/19 after having right sided weakness. The patient was in her usual state of health until two days prior to arrival she was having left sided headaches. The evening before being brought into the ED, the patient was having right sided weakness. EMS was called to the patient's home, where they note she had seizure like activity once in route to the hospital.  She was then brought into the ED and  while receiving a CT of the head, she had a witnessed seizure with convulsions of the left side of her body. These were witnessed by neurology.She also had one episode of fever while in the ED of 101.8   Upon my examination, the patient is unresponsive to questions or verbal commands. She moans with sternal rub but is unable to answer any of my questions at this time. The above information was obtained from documentation since her hospital admission.    Review of Systems: Review of Systems  Unable to perform ROS: Patient unresponsive    Past Medical History:  Diagnosis  Date  . CAD (coronary artery disease), native coronary artery    has stents  . COPD (chronic obstructive pulmonary disease) (HCC)   . Hypertension     Social History   Tobacco Use  . Smoking status: Current Every Day Smoker    Packs/day: 2.00    Years: 45.00    Pack years: 90.00  . Smokeless tobacco: Never Used  Substance Use Topics  . Alcohol use: Yes    Comment: rare  . Drug use: Never    Family History  Problem Relation Age of Onset  . Seizures Neg Hx    Allergies  Allergen Reactions  . Aspirin Rash  . Penicillins Rash  . Sulfa Antibiotics Rash    OBJECTIVE: Blood pressure (!) 98/58, pulse 69, temperature 98.2 F (36.8 C), temperature source Oral, resp. rate 20, height 5\' 5"  (1.651 m), weight 107.5 kg, SpO2 100 %.  Physical Exam Vitals and nursing note reviewed.  Constitutional:      Comments: Unresponsive to verbal commands, moans with sternal rub.   Cardiovascular:     Rate and Rhythm: Normal rate and regular rhythm.     Pulses: Normal pulses.     Heart sounds: No murmur heard.  No friction rub. No gallop.   Pulmonary:     Effort: Pulmonary effort is normal.     Breath sounds: Normal breath sounds.     Comments: Only able to auscultate anterior sounds at this time due to patient's unresponsiveness  Musculoskeletal:     Right lower leg: No edema.     Left lower leg: No edema.  Skin:    General: Skin is warm and dry.     Findings: No rash.    Limited physical exam due to patient's unresponsiveness.   Lab Results Lab Results  Component Value Date   WBC 16.6 (H) 11/30/2019   HGB 17.0 (H) 11/30/2019   HCT 50.0 (H) 11/30/2019   MCV 88.4 11/30/2019   PLT 328 11/30/2019    Lab Results  Component Value Date   CREATININE 0.60 11/30/2019   BUN 12 11/30/2019   NA 140 11/30/2019   K 3.6 11/30/2019   CL 108 11/30/2019   CO2 18 (L) 11/30/2019    Lab Results  Component Value Date   ALT 19 11/30/2019   AST 18 11/30/2019   ALKPHOS 87 11/30/2019    BILITOT 1.2 11/30/2019     Microbiology: Recent Results (from the past 240 hour(s))  Respiratory Panel by RT PCR (Flu A&B, Covid) - Nasopharyngeal Swab     Status: None   Collection Time: 11/30/19  1:25 PM   Specimen: Nasopharyngeal Swab  Result Value Ref Range Status   SARS Coronavirus 2 by RT PCR NEGATIVE NEGATIVE Final    Comment: (NOTE) SARS-CoV-2 target nucleic acids are NOT DETECTED.  The SARS-CoV-2 RNA is generally detectable in upper respiratoy specimens during the  acute phase of infection. The lowest concentration of SARS-CoV-2 viral copies this assay can detect is 131 copies/mL. A negative result does not preclude SARS-Cov-2 infection and should not be used as the sole basis for treatment or other patient management decisions. A negative result may occur with  improper specimen collection/handling, submission of specimen other than nasopharyngeal swab, presence of viral mutation(s) within the areas targeted by this assay, and inadequate number of viral copies (<131 copies/mL). A negative result must be combined with clinical observations, patient history, and epidemiological information. The expected result is Negative.  Fact Sheet for Patients:  https://www.moore.com/  Fact Sheet for Healthcare Providers:  https://www.young.biz/  This test is no t yet approved or cleared by the [place] FDA and  has been authorized for detection and/or diagnosis of SARS-CoV-2 by FDA under an Emergency Use Authorization (EUA). This EUA will remain  in effect (meaning this test can be used) for the duration of the COVID-19 declaration under Section 564(b)(1) of the Act, 21 U.S.C. section 360bbb-3(b)(1), unless the authorization is terminated or revoked sooner.     Influenza A by PCR NEGATIVE NEGATIVE Final   Influenza B by PCR NEGATIVE NEGATIVE Final    Comment: (NOTE) The Xpert Xpress SARS-CoV-2/FLU/RSV assay is intended as an aid in    the diagnosis of influenza from Nasopharyngeal swab specimens and  should not be used as a sole basis for treatment. Nasal washings and  aspirates are unacceptable for Xpert Xpress SARS-CoV-2/FLU/RSV  testing.  Fact Sheet for Patients: https://www.moore.com/  Fact Sheet for Healthcare Providers: https://www.young.biz/  This test is not yet approved or cleared by the [place] FDA and  has been authorized for detection and/or diagnosis of SARS-CoV-2 by  FDA under an Emergency Use Authorization (EUA). This EUA will remain  in effect (meaning this test can be used) for the duration of the  Covid-19 declaration under Section 564(b)(1) of the Act, 21  U.S.C. section 360bbb-3(b)(1), unless the authorization is  terminated or revoked. Performed at [facility], [address], [place], [place] [address]   CSF culture     Status: None (Preliminary result)   Collection Time: 11/30/19  5:10 PM   Specimen: PATH Cytology CSF; Cerebrospinal Fluid  Result Value Ref Range Status   Specimen Description CSF  Final   Special Requests NONE  Final   Gram Stain   Final    WBC PRESENT,BOTH PMN AND MONONUCLEAR NO ORGANISMS SEEN CYTOSPIN SMEAR    Culture   Final    NO GROWTH < 24 HOURS Performed at [facility], [address] [place], [place] [address]    Report Status PENDING  Incomplete    Thalia Bloodgood, DO Internal Medicine Resident

## 2019-12-01 NOTE — Progress Notes (Signed)
  Echocardiogram 2D Echocardiogram has been performed.  Jashun Puertas G Kylil Swopes 12/01/2019, 11:46 AM

## 2019-12-01 NOTE — Progress Notes (Signed)
[facility] PROGRESS NOTE    Julia Medina  ZOX:[id number] DOB: [birth year] DOA: 11/30/2019 PCP: Patient, No Pcp Per      Brief Narrative:  Julia Medina is a 64 y.o. F with CAD s/p PCIx2 last 2019, HTN, smoking, COPD not on O2 who presented with confusion, fever, seizures.  Patient had been in usual state of health until few days ago when she was not feeling well.  Overnight the night of admission she started to have right arm and leg shaking with loss of consciousness brought to the ER.  In the ER had witnessed seizure, fever, tachycardia.        Assessment & Plan:  Aseptic meningitis WBC 26 in tube 4, bacterial meningitis ruled out.  Suspect an aseptic meningitis, HSV pending as the "miscellaneous send out".  Defer non-infectious differential to Neurology.  Seems to be more alert and following some commands this morning, still very sleepy, nonverbal -Continue acyclovir -If not more alert enough to swallow by tomorrow, will likely need NG tube placement consideration -Consult ID, appreciate cares    Abnormal left MCA signal No large vessel occlusion suspected, MRI brain rules out stroke -Attempt MRA when able  Coronary disease secondary prevention Hypertension BP soft -Hold Plavix, Lasix, HCTZ, metoproll and Zocor for now  COPD Smoking No evidence of COPD flare  Low TSH Probably euthyroid sick -Check fT4 and T3  Hyperglycemia -Check A1c -Start accuchecks and SSI      Disposition: Status is: Inpatient  Remains inpatient appropriate because:Severe altered mental status   Dispo: The patient is from: Home              Anticipated d/c is to: SNF              Anticipated d/c date is: > 3 days              Patient currently is not medically stable to d/c.    Patient presented with altered mental status and fever.  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  She is consistently very altered unable to swallow or interact  appropriately.  Likely will need several more days treatment in the hospital          MDM: The below labs and imaging reports were reviewed and summarized above.  Medication management as above.  Persistent neuro status changes.   DVT prophylaxis: enoxaparin (LOVENOX) injection 40 mg Start: 11/30/19 1800  Code Status: FULL Family Communication:     Consultants:   Neurology  ID  Procedures:   LP 9/29  Antimicrobials:   Vancomycin/ceftriaxone 9/29 >> 9/30  Acyclovir 9/29 >>   Culture data:   CSF culture 9/29 >> pending           Subjective: Blood pressure soft this morning, improved with fluids.  Opens eyes and attempts to follow commands, but no spontaneous verbalizations.  No further fever.  No respiratory distress no more seizures.  Objective: Vitals:   12/01/19 0330 12/01/19 0345 12/01/19 0600 12/01/19 0700  BP: 110/60  114/64 (!) 98/58  Pulse:  76 71 69  Resp: 12 15 15 20   Temp:    98.2 F (36.8 C)  TempSrc:    Oral  SpO2:  93% 96% 100%  Weight:      Height:       No intake or output data in the 24 hours ending 12/01/19 0929 Filed Weights   11/30/19 1600  Weight: 107.5 kg    Examination: General  appearance: Obese adult female, somnolent, withdraws from pain, no obvious distress.   HEENT: Anicteric, conjunctiva pink, lids and lashes normal.  Slight eye crustiness.  No nasal deformity, discharge, epistaxis.  Lips moist.   Skin: Warm and dry.  No suspicious rashes or lesions. Cardiac: RRR, nl S1-S2, no murmurs appreciated.  Capillary refill is brisk.  JVP not visible.  No LE edema.  Radial pulses 2+ and symmetric. Respiratory: Normal respiratory rate and rhythm.  CTAB without rales or wheezes. Abdomen: Abdomen soft.  No TTP or guarding. No ascites, distension, hepatosplenomegaly.   MSK: No deformities or effusions. Neuro: Awake, opens eyes, makes eye contact, attempts to follow commands, appears to have symmetric upper extremity strength,   Psych: Densely encephalopathic    Data Reviewed: I have personally reviewed following labs and imaging studies:  CBC: Recent Labs  Lab 11/30/19 1248 11/30/19 1251  WBC 16.6*  --   NEUTROABS 12.8*  --   HGB 16.4* 17.0*  HCT 49.5* 50.0*  MCV 88.4  --   PLT 328  --    Basic Metabolic Panel: Recent Labs  Lab 11/30/19 1248 11/30/19 1251  NA 139 140  K 3.7 3.6  CL 104 108  CO2 18*  --   GLUCOSE 337* 340*  BUN 12 12  CREATININE 1.01* 0.60  CALCIUM 9.9  --    GFR: Estimated Creatinine Clearance: 86.6 mL/min (by C-G formula based on SCr of 0.6 mg/dL). Liver Function Tests: Recent Labs  Lab 11/30/19 1248  AST 18  ALT 19  ALKPHOS 87  BILITOT 1.2  PROT 7.5  ALBUMIN 3.7   No results for input(s): LIPASE, AMYLASE in the last 168 hours. No results for input(s): AMMONIA in the last 168 hours. Coagulation Profile: Recent Labs  Lab 11/30/19 1248  INR 1.1   Cardiac Enzymes: No results for input(s): CKTOTAL, CKMB, CKMBINDEX, TROPONINI in the last 168 hours. BNP (last 3 results) No results for input(s): PROBNP in the last 8760 hours. HbA1C: No results for input(s): HGBA1C in the last 72 hours. CBG: Recent Labs  Lab 11/30/19 1244 12/01/19 0839  GLUCAP 373* 353*   Lipid Profile: Recent Labs    12/01/19 0134  CHOL 180  HDL 31*  LDLCALC 125*  TRIG 122  CHOLHDL 5.8   Thyroid Function Tests: Recent Labs    12/01/19 0134  TSH 0.249*   Anemia Panel: No results for input(s): VITAMINB12, FOLATE, FERRITIN, TIBC, IRON, RETICCTPCT in the last 72 hours. Urine analysis: No results found for: COLORURINE, APPEARANCEUR, LABSPEC, PHURINE, GLUCOSEU, HGBUR, BILIRUBINUR, KETONESUR, PROTEINUR, UROBILINOGEN, NITRITE, LEUKOCYTESUR Sepsis Labs: @LABRCNTIP (procalcitonin:4,lacticacidven:4)  ) Recent Results (from the past 240 hour(s))  Respiratory Panel by RT PCR (Flu A&B, Covid) - Nasopharyngeal Swab     Status: None   Collection Time: 11/30/19  1:25 PM   Specimen:  Nasopharyngeal Swab  Result Value Ref Range Status   SARS Coronavirus 2 by RT PCR NEGATIVE NEGATIVE Final    Comment: (NOTE) SARS-CoV-2 target nucleic acids are NOT DETECTED.  The SARS-CoV-2 RNA is generally detectable in upper respiratoy specimens during the acute phase of infection. The lowest concentration of SARS-CoV-2 viral copies this assay can detect is 131 copies/mL. A negative result does not preclude SARS-Cov-2 infection and should not be used as the sole basis for treatment or other patient management decisions. A negative result may occur with  improper specimen collection/handling, submission of specimen other than nasopharyngeal swab, presence of viral mutation(s) within the areas targeted by this assay, and inadequate  number of viral copies (<131 copies/mL). A negative result must be combined with clinical observations, patient history, and epidemiological information. The expected result is Negative.  Fact Sheet for Patients:  https://www.moore.com/  Fact Sheet for Healthcare Providers:  https://www.young.biz/  This test is no t yet approved or cleared by the [place] FDA and  has been authorized for detection and/or diagnosis of SARS-CoV-2 by FDA under an Emergency Use Authorization (EUA). This EUA will remain  in effect (meaning this test can be used) for the duration of the COVID-19 declaration under Section 564(b)(1) of the Act, 21 U.S.C. section 360bbb-3(b)(1), unless the authorization is terminated or revoked sooner.     Influenza A by PCR NEGATIVE NEGATIVE Final   Influenza B by PCR NEGATIVE NEGATIVE Final    Comment: (NOTE) The Xpert Xpress SARS-CoV-2/FLU/RSV assay is intended as an aid in  the diagnosis of influenza from Nasopharyngeal swab specimens and  should not be used as a sole basis for treatment. Nasal washings and  aspirates are unacceptable for Xpert Xpress SARS-CoV-2/FLU/RSV  testing.  Fact Sheet  for Patients: https://www.moore.com/  Fact Sheet for Healthcare Providers: https://www.young.biz/  This test is not yet approved or cleared by the [place] FDA and  has been authorized for detection and/or diagnosis of SARS-CoV-2 by  FDA under an Emergency Use Authorization (EUA). This EUA will remain  in effect (meaning this test can be used) for the duration of the  Covid-19 declaration under Section 564(b)(1) of the Act, 21  U.S.C. section 360bbb-3(b)(1), unless the authorization is  terminated or revoked. Performed at [facility], [address] [place], [place] [address]   CSF culture     Status: None (Preliminary result)   Collection Time: 11/30/19  5:10 PM   Specimen: PATH Cytology CSF; Cerebrospinal Fluid  Result Value Ref Range Status   Specimen Description CSF  Final   Special Requests NONE  Final   Gram Stain   Final    WBC PRESENT,BOTH PMN AND MONONUCLEAR NO ORGANISMS SEEN CYTOSPIN SMEAR Performed at [facility], [address], [place], [place] [address]    Culture PENDING  Incomplete   Report Status PENDING  Incomplete         Radiology Studies: MR BRAIN WO CONTRAST  Result Date: 11/30/2019 CLINICAL DATA:  64 year old female code stroke presentation, with seizure in the CT suite. CTA suspicious for left MCA ELVO, but negative CT Perfusion and evidence of underlying intracranial atherosclerosis. EXAM: MRI HEAD WITHOUT CONTRAST TECHNIQUE: Multiplanar, multiecho pulse sequences of the brain and surrounding structures were obtained without intravenous contrast. COMPARISON:  CT head, CTP and CTA today. FINDINGS: Study limited to DWI, axial FLAIR and T2* imaging at the request of neurology. Diffusion-weighted imaging is mildly motion degraded, but both axial and coronal DWI appear negative for restricted diffusion or acute infarct. Also, no hippocampal formation diffusion abnormality is identified. Motion  degraded but otherwise unremarkable FLAIR imaging. T2* also appears negative. No intracranial mass effect. No ventriculomegaly. Initially intracranial MRA was also planned but the examination had to be discontinued prior to completion. IMPRESSION: No evidence of acute ischemia. This study was discussed both with NIR, and also Dr. Marisue Humble at 1420 and 1430 hours respectively. We agreed the constellation favors intracranial atherosclerosis over emergent large vessel occlusion. And although there are no DWI findings of status epilepticus, sequelae of seizure is still possible. Electronically Signed   By: Odessa Fleming M.D.   On: 11/30/2019 14:51  CT CEREBRAL PERFUSION W CONTRAST  Result Date: 11/30/2019 CLINICAL DATA:  64 year old female with right side hemiparesis and leftward gaze. Had a seizure in the CT suite. Last known well 0130 hours. Negative plain head CT at 1257 hours today. EXAM: CT ANGIOGRAPHY HEAD AND NECK CT PERFUSION BRAIN TECHNIQUE: Multidetector CT imaging of the head and neck was performed using the standard protocol during bolus administration of intravenous contrast. Multiplanar CT image reconstructions and MIPs were obtained to evaluate the vascular anatomy. Carotid stenosis measurements (when applicable) are obtained utilizing NASCET criteria, using the distal internal carotid diameter as the denominator. Multiphase CT imaging of the brain was performed following IV bolus contrast injection. Subsequent parametric perfusion maps were calculated using RAPID software. CONTRAST:  OMNIPAQUE IOHEXOL 350 MG/ML SOLN COMPARISON:  Head CT 1257 hours today. FINDINGS: CT Brain Perfusion Findings: ASPECTS: 10 CBF (<30%) Volume: 50mL Perfusion (Tmax>6.0s) volume: 8mL Mismatch Volume: Not applicable Infarction Location:No infarct core detected. No CBF or CBV abnormality. There is 79 mL of T-max greater than 4 seconds which is mostly but not completely in the posterior left MCA territory. CTA NECK  Skeleton: Absent maxillary dentition. No acute osseous abnormality identified. Upper chest: Negative. Other neck: Mild thyroid enlargement. No discrete thyroid nodule. Upper limits of normal to mildly enlarged bilateral level 1B/2A lymph nodes, up to 9 mm short axis and slightly greater on the left. No cystic or necrotic nodes are evident. Other neck soft tissue contours are within normal limits. Aortic arch: Aberrant origin of the right subclavian artery, with shared origin of the CCA is. Three vessel arch configuration. Mild arch atherosclerosis. Right carotid system: Mildly tortuous proximal right CCA. No stenosis proximal to the bifurcation. Minimal plaque at the bifurcation. No cervical right ICA stenosis. Left carotid system: Mildly tortuous proximal left CCA with no plaque or stenosis. Mild soft and calcified plaque at the lateral left ICA origin without stenosis to the skull base. 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 Normal right vertebral artery origin. Mildly tortuous right V1 segment. Mildly non dominant right vertebral artery. Tortuosity at the C3 level. Patent right vertebral to the skull base without plaque or stenosis. Minimal plaque in the proximal left subclavian artery without stenosis. Normal left vertebral artery origin. Mildly dominant and tortuous left vertebral artery is patent to the skull base without plaque or stenosis. CTA HEAD Suboptimal intracranial IV contrast bolus. Posterior circulation: Normal V4 segments, the left is mildly dominant. Normal left PICA origin. The right AICA appears dominant and patent. Patent vertebrobasilar junction and basilar artery without stenosis. Patent SCA and PCA origins. Posterior communicating arteries are diminutive or absent. Both PCA P1 and P2 segments are patent. Both PCA bifurcations appear patent but there is asymmetrically decreased distal left PCA flow on the left (series  10, image 23). No discrete PCA branch enhancement is evident. Anterior circulation: Both ICA siphons are patent. On the right there is moderate siphon calcified plaque and moderate to severe stenosis just distal to the anterior genu (series 6, image 90). The right ICA terminus remains patent. On the left heavily calcified distal cavernous and supraclinoid ICA also, with moderate to severe supraclinoid stenosis as well (series 5, image 88). Patent left carotid terminus.  Patent MCA and ACA origins. Diminutive and irregular ACA A1 appearance a especially on the right where moderate to severe stenosis is noted on series 9, image 21. But no ACA branch occlusion is identified. Right MCA M1  segment bifurcates early without stenosis, but there is a 3 mm aneurysm directed inferiorly at the MCA bifurcation (series 9, image 2). No right M2 occlusion is identified. On the left side the M1 is patent to the bifurcation and tortuous (series 8, image 18). At the bifurcation there are asymmetrically fewer enhancing left MCA branches suggesting 1 or more M2 occlusions (see series 10, image 27 versus series 10, image 13 on the right). And M2 occlusion is suspected on the source images series 7, image 117. But other MCA branch detail is limited. Venous sinuses: Not evaluated due to early/suboptimal contrast. Anatomic variants: Aberrant origin right subclavian artery. Shared CCA origin. Review of the MIP images confirms the above findings IMPRESSION: 1. 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 2. Furthermore, the intracranial contrast bolus is suboptimal and there is intracranial atherosclerosis, including up to Severe bilateral Supraclinoid ICA stenoses. 3. There is also asymmetric decreased enhancement of the distal left PCA branches. 4. 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 5. But minimal atherosclerosis and no arterial  stenosis in the neck. 6. Mild nonspecific level 2 cervical lymphadenopathy. [facility] and CTP findings reviewed in person with Dr. Thomasena Edis and PA DAVID [facility] on 11/30/2019 at 1323 hours. And in discussion that included NIR, decision was made for follow-up stat Brain MRI (to include at least DWI and FLAIR) and intracranial MRA to augment the decision making in this case. Electronically Signed   By: Odessa Fleming M.D.   On: 11/30/2019 13:40   DG Chest Port 1 View  Result Date: 11/30/2019 CLINICAL DATA:  Altered mental status. EXAM: PORTABLE CHEST 1 VIEW COMPARISON:  December 02, 2016. FINDINGS: Stable cardiomediastinal silhouette. Bilateral interstitial opacities are noted which may represent chronic scarring or interstitial lung disease. Acute superimposed edema or inflammation cannot be excluded. No pneumothorax or pleural effusion is noted. Bony thorax is unremarkable. IMPRESSION: Bilateral interstitial opacities are noted which may represent chronic scarring or interstitial lung disease. Acute superimposed edema or inflammation cannot be excluded. Electronically Signed   By: Lupita Raider M.D.   On: 11/30/2019 13:45   CT HEAD CODE STROKE WO CONTRAST  Result Date: 11/30/2019 CLINICAL DATA:  Code stroke.  Acute neuro deficit.  Left-sided gaze. EXAM: CT HEAD WITHOUT CONTRAST TECHNIQUE: Contiguous axial images were obtained from the base of the skull through the vertex without intravenous contrast. COMPARISON:  None. FINDINGS: Brain: No evidence of acute infarction, hemorrhage, hydrocephalus, extra-axial collection or mass lesion/mass effect. Motion degraded study. Vascular: Negative for hyperdense vessel. Skull: Negative Sinuses/Orbits: Paranasal sinuses clear.  Negative orbit. Other: None ASPECTS (Alberta Stroke Program Early CT Score) - Ganglionic level infarction (caudate, lentiform nuclei, internal capsule, insula, M1-M3 cortex): 7 - Supraganglionic infarction (M4-M6 cortex): 3 Total score (0-10 with 10  being normal): 10 IMPRESSION: 1. Negative CT head 2. ASPECTS is 10 3. Code stroke imaging results were communicated on 11/30/2019 at 12:58 pm to provider [facility] via secure text paging. Electronically Signed   By: Marlan Palau M.D.   On: 11/30/2019 12:59   CT ANGIO HEAD CODE STROKE  Result Date: 11/30/2019 CLINICAL DATA:  64 year old female with right side hemiparesis and leftward gaze. Had a seizure in the CT suite. Last known well 0130 hours. Negative plain head CT at 1257 hours today. EXAM: CT ANGIOGRAPHY HEAD AND NECK CT PERFUSION BRAIN TECHNIQUE: Multidetector CT imaging of the head and neck  was performed using the standard protocol during bolus administration of intravenous contrast. Multiplanar CT image reconstructions and MIPs were obtained to evaluate the vascular anatomy. Carotid stenosis measurements (when applicable) are obtained utilizing NASCET criteria, using the distal internal carotid diameter as the denominator. Multiphase CT imaging of the brain was performed following IV bolus contrast injection. Subsequent parametric perfusion maps were calculated using RAPID software. CONTRAST:  OMNIPAQUE IOHEXOL 350 MG/ML SOLN COMPARISON:  Head CT 1257 hours today. FINDINGS: CT Brain Perfusion Findings: ASPECTS: 10 CBF (<30%) Volume: 27mL Perfusion (Tmax>6.0s) volume: 18mL Mismatch Volume: Not applicable Infarction Location:No infarct core detected. No CBF or CBV abnormality. There is 79 mL of T-max greater than 4 seconds which is mostly but not completely in the posterior left MCA territory. CTA NECK Skeleton: Absent maxillary dentition. No acute osseous abnormality identified. Upper chest: Negative. Other neck: Mild thyroid enlargement. No discrete thyroid nodule. Upper limits of normal to mildly enlarged bilateral level 1B/2A lymph nodes, up to 9 mm short axis and slightly greater on the left. No cystic or necrotic nodes are evident. Other neck soft tissue contours are within normal limits. Aortic  arch: Aberrant origin of the right subclavian artery, with shared origin of the CCA is. Three vessel arch configuration. Mild arch atherosclerosis. Right carotid system: Mildly tortuous proximal right CCA. No stenosis proximal to the bifurcation. Minimal plaque at the bifurcation. No cervical right ICA stenosis. Left carotid system: Mildly tortuous proximal left CCA with no plaque or stenosis. Mild soft and calcified plaque at the lateral left ICA origin without stenosis to the skull base. 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 Normal right vertebral artery origin. Mildly tortuous right V1 segment. Mildly non dominant right vertebral artery. Tortuosity at the C3 level. Patent right vertebral to the skull base without plaque or stenosis. Minimal plaque in the proximal left subclavian artery without stenosis. Normal left vertebral artery origin. Mildly dominant and tortuous left vertebral artery is patent to the skull base without plaque or stenosis. CTA HEAD Suboptimal intracranial IV contrast bolus. Posterior circulation: Normal V4 segments, the left is mildly dominant. Normal left PICA origin. The right AICA appears dominant and patent. Patent vertebrobasilar junction and basilar artery without stenosis. Patent SCA and PCA origins. Posterior communicating arteries are diminutive or absent. Both PCA P1 and P2 segments are patent. Both PCA bifurcations appear patent but there is asymmetrically decreased distal left PCA flow on the left (series 10, image 23). No discrete PCA branch enhancement is evident. Anterior circulation: Both ICA siphons are patent. On the right there is moderate siphon calcified plaque and moderate to severe stenosis just distal to the anterior genu (series 6, image 90). The right ICA terminus remains patent. On the left heavily calcified distal cavernous and supraclinoid ICA also, with moderate to severe supraclinoid  stenosis as well (series 5, image 88). Patent left carotid terminus.  Patent MCA and ACA origins. Diminutive and irregular ACA A1 appearance a especially on the right where moderate to severe stenosis is noted on series 9, image 21. But no ACA branch occlusion is identified. 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 No right M2 occlusion is identified. On the left side the M1 is patent to the bifurcation and tortuous (series 8, image 18). At the bifurcation there are asymmetrically fewer enhancing left MCA branches suggesting 1 or  more M2 occlusions (see series 10, image 27 versus series 10, image 13 on the right). And M2 occlusion is suspected on the source images series 7, image 117. But other MCA branch detail is limited. Venous sinuses: Not evaluated due to early/suboptimal contrast. Anatomic variants: Aberrant origin right subclavian artery. Shared CCA origin. Review of the MIP images confirms the above findings IMPRESSION: 1. 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 2. Furthermore, the intracranial contrast bolus is suboptimal and there is intracranial atherosclerosis, including up to Severe bilateral Supraclinoid ICA stenoses. 3. There is also asymmetric decreased enhancement of the distal left PCA branches. 4. 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 5. But minimal atherosclerosis and no arterial stenosis in the neck. 6. Mild nonspecific level 2 cervical lymphadenopathy. [facility] and CTP findings reviewed in person with Dr. Thomasena Edisollins and PA [facility] on 11/30/2019 at 1323 hours. And in discussion that included NIR, decision was made for follow-up stat Brain MRI (to include at least DWI and FLAIR) and intracranial MRA to augment the decision making in this case. Electronically Signed   By: Odessa FlemingH   Hall M.D.   On: 11/30/2019 13:40   CT ANGIO NECK CODE STROKE  Result Date: 11/30/2019 CLINICAL DATA:  64 year old female with right side hemiparesis and leftward gaze. Had a seizure in the CT suite. Last known well 0130 hours. Negative plain head CT at 1257 hours today. EXAM: CT ANGIOGRAPHY HEAD AND NECK CT PERFUSION BRAIN TECHNIQUE: Multidetector CT imaging of the head and neck was performed using the standard protocol during bolus administration of intravenous contrast. Multiplanar CT image reconstructions and MIPs were obtained to evaluate the vascular anatomy. Carotid stenosis measurements (when applicable) are obtained utilizing NASCET criteria, using the distal internal carotid diameter as the denominator. Multiphase CT imaging of the brain was performed following IV bolus contrast injection. Subsequent parametric perfusion maps were calculated using RAPID software. CONTRAST:  100mL OMNIPAQUE IOHEXOL 350 MG/ML SOLN COMPARISON:  Head CT 1257 hours today. FINDINGS: CT Brain Perfusion Findings: ASPECTS: 10 CBF (<30%) Volume: 0mL Perfusion (Tmax>6.0s) volume: 0mL Mismatch Volume: Not applicable Infarction Location:No infarct core detected. No CBF or CBV abnormality. There is 79 mL of T-max greater than 4 seconds which is mostly but not completely in the posterior left MCA territory. CTA NECK Skeleton: Absent maxillary dentition. No acute osseous abnormality identified. Upper chest: Negative. Other neck: Mild thyroid enlargement. No discrete thyroid nodule. Upper limits of normal to mildly enlarged bilateral level 1B/2A lymph nodes, up to 9 mm short axis and slightly greater on the left. No cystic or necrotic nodes are evident. Other neck soft tissue contours are within normal limits. Aortic arch: Aberrant origin of the right subclavian artery, with shared origin of the CCA is. Three vessel arch configuration. Mild arch atherosclerosis. Right carotid system: Mildly tortuous proximal right CCA. No stenosis  proximal to the bifurcation. Minimal plaque at the bifurcation. No cervical right ICA stenosis. Left carotid system: Mildly tortuous proximal left CCA with no plaque or stenosis. Mild soft and calcified plaque at the lateral left ICA origin without stenosis to the skull base. 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 Normal right vertebral artery origin. Mildly tortuous right V1 segment. Mildly non dominant right vertebral artery. Tortuosity at the C3 level. Patent right vertebral to the skull base without plaque or stenosis. Minimal  plaque in the proximal left subclavian artery without stenosis. Normal left vertebral artery origin. Mildly dominant and tortuous left vertebral artery is patent to the skull base without plaque or stenosis. CTA HEAD Suboptimal intracranial IV contrast bolus. Posterior circulation: Normal V4 segments, the left is mildly dominant. Normal left PICA origin. The right AICA appears dominant and patent. Patent vertebrobasilar junction and basilar artery without stenosis. Patent SCA and PCA origins. Posterior communicating arteries are diminutive or absent. Both PCA P1 and P2 segments are patent. Both PCA bifurcations appear patent but there is asymmetrically decreased distal left PCA flow on the left (series 10, image 23). No discrete PCA branch enhancement is evident. Anterior circulation: Both ICA siphons are patent. On the right there is moderate siphon calcified plaque and moderate to severe stenosis just distal to the anterior genu (series 6, image 90). The right ICA terminus remains patent. On the left heavily calcified distal cavernous and supraclinoid ICA also, with moderate to severe supraclinoid stenosis as well (series 5, image 88). Patent left carotid terminus.  Patent MCA and ACA origins. Diminutive and irregular ACA A1 appearance a especially on the right where moderate to severe stenosis is noted on series  9, image 21. But no ACA branch occlusion is identified. 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 No right M2 occlusion is identified. On the left side the M1 is patent to the bifurcation and tortuous (series 8, image 18). At the bifurcation there are asymmetrically fewer enhancing left MCA branches suggesting 1 or more M2 occlusions (see series 10, image 27 versus series 10, image 13 on the right). And M2 occlusion is suspected on the source images series 7, image 117. But other MCA branch detail is limited. Venous sinuses: Not evaluated due to early/suboptimal contrast. Anatomic variants: Aberrant origin right subclavian artery. Shared CCA origin. Review of the MIP images confirms the above findings IMPRESSION: 1. 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 2. Furthermore, the intracranial contrast bolus is suboptimal and there is intracranial atherosclerosis, including up to Severe bilateral Supraclinoid ICA stenoses. 3. There is also asymmetric decreased enhancement of the distal left PCA branches. 4. 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 5. But minimal atherosclerosis and no arterial stenosis in the neck. 6. Mild nonspecific level 2 cervical lymphadenopathy. [facility] and CTP findings reviewed in person with Dr. Thomasena Edis and PA [facility] on 11/30/2019 at 1323 hours. And in discussion that included NIR, decision was made for follow-up stat Brain MRI (to include at least DWI and FLAIR) and intracranial MRA to augment the decision making in this case. Electronically Signed   By: Odessa Fleming M.D.   On: 11/30/2019 13:40   DG Lumbar Puncture Fluoro Guide  Result Date: 11/30/2019 CLINICAL DATA:  64 year old female code stroke presentation, new onset seizure, unexplained altered mental status. Found to  be febrile unsuccessful bedside lumbar puncture in the emergency department. EXAM: DIAGNOSTIC LUMBAR PUNCTURE UNDER FLUOROSCOPIC GUIDANCE FLUOROSCOPY TIME:  Fluoroscopy Time:  0 minutes 18 seconds Radiation Exposure Index (if provided by the fluoroscopic device): Number of Acquired Spot Images: 0 PROCEDURE: Informed consent was obtained from the patient's family prior to the procedure, including potential complications of headache, allergy, and pain. With the patient prone, the lower back was prepped with Betadine. 1% Lidocaine was  used for local anesthesia. Lumbar puncture was performed at the L2-L3 level using right sub laminar technique with a 5 inch, 20 gauge needle with return of clear, colorless CSF. 17.5 mL of CSF were obtained for laboratory studies. The patient tolerated the procedure well and there were no apparent complications. Appropriate post procedural orders were placed on the chart. The patient was returned to the ED in stable condition for continued treatment. IMPRESSION: Fluoroscopic guided lumbar puncture at L2-L3. 17.5 mL of clear, colorless CSF obtained and sent to the lab for analysis. Electronically Signed   By: Odessa Fleming M.D.   On: 11/30/2019 17:25        Scheduled Meds: . docusate sodium  100 mg Oral BID  . enoxaparin (LOVENOX) injection  40 mg Subcutaneous Q24H  . insulin aspart  0-15 Units Subcutaneous Q4H  . lidocaine (PF)  2 mL Intradermal Once   Continuous Infusions: . acyclovir Stopped (12/01/19 0303)  . lactated ringers 100 mL/hr at 12/01/19 0029     LOS: 1 day    Time spent: 35 minutes    Alberteen Sam, MD [facility] 12/01/2019, 9:29 AM     Please page though AMION or Epic secure chat:  For [employer], Higher education careers adviser

## 2019-12-02 LAB — GLUCOSE, CAPILLARY
Glucose-Capillary: 229 mg/dL — ABNORMAL HIGH (ref 70–99)
Glucose-Capillary: 237 mg/dL — ABNORMAL HIGH (ref 70–99)
Glucose-Capillary: 263 mg/dL — ABNORMAL HIGH (ref 70–99)
Glucose-Capillary: 295 mg/dL — ABNORMAL HIGH (ref 70–99)
Glucose-Capillary: 295 mg/dL — ABNORMAL HIGH (ref 70–99)
Glucose-Capillary: 303 mg/dL — ABNORMAL HIGH (ref 70–99)
Glucose-Capillary: 321 mg/dL — ABNORMAL HIGH (ref 70–99)

## 2019-12-02 LAB — PROCALCITONIN: Procalcitonin: <0.1 ng/mL

## 2019-12-02 LAB — HEMOGLOBIN A1C
Hgb A1c MFr Bld: >15.5 % — ABNORMAL HIGH (ref 4.8–5.6)
Hgb A1c MFr Bld: >15.5 % — ABNORMAL HIGH (ref 4.8–5.6)
Mean Plasma Glucose: >398 mg/dL
Mean Plasma Glucose: >398 mg/dL

## 2019-12-02 LAB — T3: T3, Total: 58 ng/dL — ABNORMAL LOW (ref 71–180)

## 2019-12-02 MED ORDER — INSULIN GLARGINE 100 UNIT/ML ~~LOC~~ SOLN
14.0000 [IU] | Freq: Every day | SUBCUTANEOUS | Status: DC
  Administered 2019-12-02: 14 [IU] via SUBCUTANEOUS
  Filled 2019-12-02 (×2): qty 0.14

## 2019-12-02 MED ORDER — METFORMIN HCL 500 MG PO TABS
500.0000 mg | ORAL_TABLET | Freq: Every day | ORAL | Status: DC
  Administered 2019-12-03: 500 mg via ORAL
  Filled 2019-12-02: qty 1

## 2019-12-02 NOTE — Evaluation (Signed)
Physical Therapy Evaluation Patient Details Name: Julia Medina MRN: [id number] DOB: 1956-01-11 Today's Date: 12/02/2019   History of Present Illness  64 y.o. female with medical history significant of COPD; CAD with stents; and HTN presenting with R-sided weakness and seizure. Pt underwent LP on 11/30/2019. 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  Clinical Impression  Pt presents to PT with deficits in functional mobility, gait, balance, endurance, safety awareness, strength/power, DME management, and cognition. Pt with R weakness and significant instability when mobilizing without the use of BUE support. This improves some with use of RW, however the pt demonstrates poor device management and continues to require minA to reduce falls risk. Pt will benefit from continued acute PT services to reduce falls risk and to return to independence. PT anticipates the pt will progress and be able to discharge home with HHPT and assistance from spouse and friend with use of RW for all OOB mobility.    Follow Up Recommendations Home health PT;Supervision for mobility/OOB    Equipment Recommendations  3in1 (PT)    Recommendations for Other Services       Precautions / Restrictions Precautions Precautions: Fall;Other (comment) Precaution Comments: seizure Restrictions Weight Bearing Restrictions: No      Mobility  Bed Mobility Overal bed mobility: Needs Assistance Bed Mobility: Supine to Sit     Supine to sit: Min guard;HOB elevated     General bed mobility comments: use of bed rails and increased time  Transfers Overall transfer level: Needs assistance Equipment used: Rolling walker (2 wheeled);1 person hand held assist Transfers: Sit to/from [employer] Sit to Stand: Min assist Stand pivot transfers: Min assist       General transfer comment: pt with icnreased sway and some assistance to power up to standing along with PT verbal cues for hand  placement  Ambulation/Gait Ambulation/Gait assistance: Mod assist;Min assist Gait Distance (Feet): 5 Feet Assistive device: Rolling walker (2 wheeled);1 person hand held assist Gait Pattern/deviations: Step-to pattern;Shuffle;Trunk flexed;Staggering left Gait velocity: reduced Gait velocity interpretation: <1.31 ft/sec, indicative of household ambulator General Gait Details: pt with signficant loss of balance with PT hand hold requiring modA to correct, reduced sway with use of RW however pt continues to require minA for device management and to maintain balance  Stairs            Wheelchair Mobility    Modified Rankin (Stroke Patients Only)       Balance Overall balance assessment: Needs assistance Sitting-balance support: No upper extremity supported;Feet supported Sitting balance-Leahy Scale: Fair     Standing balance support: Bilateral upper extremity supported Standing balance-Leahy Scale: Poor Standing balance comment: reliant on UE support and minG                             Pertinent Vitals/Pain Pain Assessment: Faces Faces Pain Scale: No hurt    Home Living Family/patient expects to be discharged to:: Private residence Living Arrangements: Spouse/significant other;Non-relatives/Friends Available Help at Discharge: Family;Available 24 hours/day;Friend(s) Type of Home: House Home Access: Stairs to enter Entrance Stairs-Rails: None Entrance Stairs-Number of Steps: 1 Home Layout: One level Home Equipment: Walker - 2 wheels      Prior Function Level of Independence: Independent         Comments: pt does not drive     Hand Dominance        Extremity/Trunk Assessment   Upper Extremity Assessment Upper Extremity Assessment: RUE deficits/detail RUE Deficits /  Details: shoulder flexion 3/5, elbow flexion 3-/5 elbow extension 3/5, grip 3/5    Lower Extremity Assessment Lower Extremity Assessment: RLE deficits/detail RLE Deficits /  Details: grossly 4-/5    Cervical / Trunk Assessment Cervical / Trunk Assessment: Normal  Communication   Communication: No difficulties  Cognition Arousal/Alertness: Awake/alert Behavior During Therapy: WFL for tasks assessed/performed Overall Cognitive Status: Impaired/Different from baseline Area of Impairment: Safety/judgement;Awareness;Problem solving                         Safety/Judgement: Decreased awareness of safety;Decreased awareness of deficits Awareness: Emergent Problem Solving: Slow processing        General Comments General comments (skin integrity, edema, etc.): VSS on RA    Exercises     Assessment/Plan    PT Assessment Patient needs continued PT services  PT Problem List Decreased strength;Decreased activity tolerance;Decreased balance;Decreased mobility;Decreased cognition;Decreased knowledge of use of DME;Decreased safety awareness       PT Treatment Interventions DME instruction;Gait training;Stair training;Functional mobility training;Therapeutic activities;Therapeutic exercise;Balance training;Neuromuscular re-education;Cognitive remediation;Patient/family education    PT Goals (Current goals can be found in the Care Plan section)  Acute Rehab PT Goals Patient Stated Goal: To get better and return to prior level of function PT Goal Formulation: With patient Time For Goal Achievement: 12/16/19 Potential to Achieve Goals: Good    Frequency Min 3X/week   Barriers to discharge        Co-evaluation               AM-PAC PT "6 Clicks" Mobility  Outcome Measure Help needed turning from your back to your side while in a flat bed without using bedrails?: None Help needed moving from lying on your back to sitting on the side of a flat bed without using bedrails?: A Little Help needed moving to and from a bed to a chair (including a wheelchair)?: A Lot Help needed standing up from a chair using your arms (e.g., wheelchair or bedside  chair)?: A Little Help needed to walk in hospital room?: A Lot Help needed climbing 3-5 steps with a railing? : Total 6 Click Score: 15    End of Session   Activity Tolerance: Patient tolerated treatment well Patient left: in bed;with call bell/phone within reach;with bed alarm set;with family/visitor present Nurse Communication: Mobility status PT Visit Diagnosis: Unsteadiness on feet (R26.81);Other abnormalities of gait and mobility (R26.89);Muscle weakness (generalized) (M62.81);Other symptoms and signs involving the nervous system (R29.898)    Time: 4709-6283 PT Time Calculation (min) (ACUTE ONLY): 24 min   Charges:   PT Evaluation $PT Eval Moderate Complexity: 1 Mod PT Treatments $Therapeutic Activity: 8-22 mins        Arlyss Gandy, PT, DPT Acute Rehabilitation Pager: [phone]   Arlyss Gandy 12/02/2019, 4:51 PM

## 2019-12-02 NOTE — Plan of Care (Signed)

## 2019-12-02 NOTE — Progress Notes (Signed)
Inpatient Diabetes Program Recommendations  AACE/ADA: New Consensus Statement on Inpatient Glycemic Control (2015)  Target Ranges:  Prepandial:   less than 140 mg/dL      Peak postprandial:   less than 180 mg/dL (1-2 hours)      Critically ill patients:  140 - 180 mg/dL   Lab Results  Component Value Date   GLUCAP 321 (H) 12/02/2019    Review of Glycemic Control  Results for TOLUWANIMI, RADEBAUGH (MRN [id number]) as of 12/02/2019 10:06  Ref. Range 12/01/2019 08:39 12/01/2019 11:47 12/01/2019 15:53 12/01/2019 19:40 12/02/2019 00:16 12/02/2019 03:47 12/02/2019 07:41  Glucose-Capillary Latest Ref Range: 70 - 99 mg/dL 379 (H) 432 (H) 761 (H) 176 (H) 237 (H) 229 (H) 321 (H)   Diabetes history: New DM this admission awaiting A1c   Current orders for Inpatient glycemic control:  Novolog 0-15 units Q4 hours  Inpatient Diabetes Program Recommendations:    - Consider Levemir 15 units.  Will follow glucose trends and educate when appropriate.  Thanks,  Christena Deem RN, MSN, BC-ADM Inpatient Diabetes Coordinator Team Pager [phone] (8a-5p)

## 2019-12-02 NOTE — Progress Notes (Signed)
[facility]   Reason for visit: Follow up on encephalitis  Interval History: much improved.  Afebrile now.  Does not remember the episode that brought her here.  HSV PCR not originially ordered and is only available as a miscellaneous test through [employer].   Day 3 acyclovir   Physical Exam: Constitutional:  Vitals:   12/02/19 0405 12/02/19 0743  BP: (!) 141/74 120/72  Pulse: 95 71  Resp: 18 16  Temp: 98.1 F (36.7 C) 98.3 F (36.8 C)  SpO2:  100%   patient appears in NAD; up in chair Respiratory: Normal respiratory effort; CTA B Cardiovascular: RRR Neuro: alert, non-focal, oriented x 3  Review of Systems: Constitutional: negative for fevers and chills Gastrointestinal: negative for nausea and diarrhea  Lab Results  Component Value Date   WBC 18.2 (H) 12/01/2019   HGB 16.4 (H) 12/01/2019   HCT 49.3 (H) 12/01/2019   MCV 90.6 12/01/2019   PLT 297 12/01/2019    Lab Results  Component Value Date   CREATININE 0.79 12/01/2019   BUN 19 12/01/2019   NA 146 (H) 12/01/2019   K 3.3 (L) 12/01/2019   CL 108 12/01/2019   CO2 24 12/01/2019    Lab Results  Component Value Date   ALT 18 12/01/2019   AST 22 12/01/2019   ALKPHOS 92 12/01/2019     Microbiology: Recent Results (from the past 240 hour(s))  Respiratory Panel by RT PCR (Flu A&B, Covid) - Nasopharyngeal Swab     Status: None   Collection Time: 11/30/19  1:25 PM   Specimen: Nasopharyngeal Swab  Result Value Ref Range Status   SARS Coronavirus 2 by RT PCR NEGATIVE NEGATIVE Final    Comment: (NOTE) SARS-CoV-2 target nucleic acids are NOT DETECTED.  The SARS-CoV-2 RNA is generally detectable in upper respiratoy specimens during the acute phase of infection. The lowest concentration of SARS-CoV-2 viral copies this assay can detect is 131 copies/mL. A negative result does not preclude SARS-Cov-2 infection and should not be used as the sole basis for treatment or other patient  management decisions. A negative result may occur with  improper specimen collection/handling, submission of specimen other than nasopharyngeal swab, presence of viral mutation(s) within the areas targeted by this assay, and inadequate number of viral copies (<131 copies/mL). A negative result must be combined with clinical observations, patient history, and epidemiological information. The expected result is Negative.  Fact Sheet for Patients:  https://www.moore.com/  Fact Sheet for Healthcare Providers:  https://www.young.biz/  This test is no t yet approved or cleared by the [place] FDA and  has been authorized for detection and/or diagnosis of SARS-CoV-2 by FDA under an Emergency Use Authorization (EUA). This EUA will remain  in effect (meaning this test can be used) for the duration of the COVID-19 declaration under Section 564(b)(1) of the Act, 21 U.S.C. section 360bbb-3(b)(1), unless the authorization is terminated or revoked sooner.     Influenza A by PCR NEGATIVE NEGATIVE Final   Influenza B by PCR NEGATIVE NEGATIVE Final    Comment: (NOTE) The Xpert Xpress SARS-CoV-2/FLU/RSV assay is intended as an aid in  the diagnosis of influenza from Nasopharyngeal swab specimens and  should not be used as a sole basis for treatment. Nasal washings and  aspirates are unacceptable for Xpert Xpress SARS-CoV-2/FLU/RSV  testing.  Fact Sheet for Patients: https://www.moore.com/  Fact Sheet for Healthcare Providers: https://www.young.biz/  This test is not yet approved or cleared by the [place] FDA and  has been authorized for detection and/or diagnosis of SARS-CoV-2 by  FDA under an Emergency Use Authorization (EUA). This EUA will remain  in effect (meaning this test can be used) for the duration of the  Covid-19 declaration under Section 564(b)(1) of the Act, 21  U.S.C. section 360bbb-3(b)(1),  unless the authorization is  terminated or revoked. Performed at [facility], [address] [place], [place] [address]   Culture, blood (routine x 2)     Status: None (Preliminary result)   Collection Time: 11/30/19  3:06 PM   Specimen: BLOOD RIGHT WRIST  Result Value Ref Range Status   Specimen Description BLOOD RIGHT WRIST  Final   Special Requests   Final    BOTTLES DRAWN AEROBIC AND ANAEROBIC Blood Culture results may not be optimal due to an inadequate volume of blood received in culture bottles   Culture   Final    NO GROWTH 2 DAYS Performed at [facility], [address] [place], [place] [address]    Report Status PENDING  Incomplete  CSF culture     Status: None (Preliminary result)   Collection Time: 11/30/19  5:10 PM   Specimen: PATH Cytology CSF; Cerebrospinal Fluid  Result Value Ref Range Status   Specimen Description CSF  Final   Special Requests NONE  Final   Gram Stain   Final    WBC PRESENT,BOTH PMN AND MONONUCLEAR NO ORGANISMS SEEN CYTOSPIN SMEAR    Culture   Final    NO GROWTH 2 DAYS Performed at [facility], [address] [place], [place] [address]    Report Status PENDING  Incomplete  Culture, fungus without smear     Status: None (Preliminary result)   Collection Time: 11/30/19  5:10 PM   Specimen: PATH Cytology CSF; Cerebrospinal Fluid  Result Value Ref Range Status   Specimen Description CSF  Final   Special Requests NONE  Final   Culture   Final    NO FUNGUS ISOLATED AFTER 1 DAY Performed at [facility], [address], [place], [place] [address]    Report Status PENDING  Incomplete  MRSA PCR Screening     Status: Abnormal   Collection Time: 12/01/19  7:00 AM   Specimen: Nasal Mucosa; Nasopharyngeal  Result Value Ref Range Status   MRSA by PCR POSITIVE (A) NEGATIVE Final    Comment:        The GeneXpert MRSA Assay (FDA approved for NASAL specimens only), is one component of a comprehensive MRSA  colonization surveillance program. It is not intended to diagnose MRSA infection nor to guide or monitor treatment for MRSA infections. RESULT CALLED TO, READ BACK BY AND VERIFIED WITH: Joni Reining RN 10:40 12/01/19 ([email or web address]) Performed at [facility], [address] [place], [place] [address]     Impression/Plan:  1. Viral encephalitis - much improved now and mental status seems appropriate and likely her baseline/normal.  No further seizures.  No positive results and HSV ordered misc since it is no longer available as a routine test.  VDRL negative. Cytology negative.   Will continue with IV acyclovir.   If HSV is negative, she will not need further acyclovir, if positive for HSV 1, will need to continue with IV acyclovir.    2.  Seizure - focal and c/w #1.  On seizure medication per neurology.    3.  DM - noted very high Hgb A1c and getting  DM education.    Dr. Drue Second available over the weekend if needed, otherwise I will follow up on Monday.

## 2019-12-02 NOTE — Progress Notes (Signed)
[facility] PROGRESS NOTE    Julia Medina  ATF:[id number] DOB: 1956-02-21 DOA: 11/30/2019 PCP: Patient, No Pcp Per      Brief Narrative:  Mrs. Eisen is a 64 y.o. F with CAD s/p PCIx2 last 2019, HTN, smoking, COPD not on O2 who presented with confusion, fever, seizures.  Patient had been in usual state of health until few days ago when she was not feeling well.  Overnight the night of admission she started to have right arm and leg shaking with loss of consciousness brought to the ER.  In the ER had witnessed seizure, fever, tachycardia.        Assessment & Plan:  Encephalitis WBC 26 in tube 4, bacterial meningitis ruled out.  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  Defer non-infectious differential to Neurology.  Improving considerably -Continue acyclovir -Follow labs -Consult ID, appreciate cares    Abnormal left MCA signal -Defer to neurology  Coronary disease secondary prevention Hypertension Blood pressure still relatively low -Hold Plavix, Lasix, HCTZ, metoprolol, Zocor  Diabetes Previously not diagnosed, hemoglobin A1c greater than 15%. -Start Lantus -Start Metformin -Continue sliding scale corrections  COPD Smoking No evidence of COPD flare  Euthyroid sick       Disposition: Status is: Inpatient  Remains inpatient appropriate because: IV medication still warranted given severity of disease   Dispo: The patient is from: Home              Anticipated d/c is to: SNF              Anticipated d/c date is: > 3 days              Patient currently is not medically stable to d/c.    Patient presented with altered mental status and fever.  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  She is improving, but we are awaiting testing, physical therapy evaluation.         MDM: The below labs and imaging reports were reviewed and summarized above.  Medication management as above.  Persistent  neuro status changes.   DVT prophylaxis: enoxaparin (LOVENOX) injection 40 mg Start: 11/30/19 1800  Code Status: FULL Family Communication:     Consultants:   Neurology  ID  Procedures:   LP 9/29  Antimicrobials:   Vancomycin/ceftriaxone 9/29 >> 9/30  Acyclovir 9/29 >>   Culture data:   CSF culture 9/29 >> pending           Subjective: More oriented today, interactive.  Ambulatory.  No vomiting, no confusion.  Objective: Vitals:   12/02/19 0405 12/02/19 0743 12/02/19 1112 12/02/19 1702  BP: (!) 141/74 120/72 124/82 111/67  Pulse: 95 71 90 79  Resp: 18 16 20 17   Temp: 98.1 F (36.7 C) 98.3 F (36.8 C) 98.3 F (36.8 C) 98.4 F (36.9 C)  TempSrc: Oral Oral Oral Oral  SpO2:  100% 91% 93%  Weight:      Height:        Intake/Output Summary (Last 24 hours) at 12/02/2019 1721 Last data filed at 12/02/2019 1322 Gross per 24 hour  Intake 417 ml  Output 200 ml  Net 217 ml   Filed Weights   11/30/19 1600  Weight: 107.5 kg    Examination: General appearance: Obese adult female, awake, more interactive today HEENT: Anicteric, conjunctiva pink, lids and lashes normal.    No nasal deformity, discharge, epistaxis.  Lips moist.   Skin: Warm and dry.  No suspicious rashes or lesions. Cardiac: RRR, nl S1-S2, no murmurs appreciated.  Capillary refill is brisk.  JVP not visible.  No LE edema.  Radial pulses 2+ and symmetric. Respiratory: Normal respiratory rate and rhythm.  CTAB without rales or wheezes. Abdomen: Abdomen soft.  No TTP or guarding. No ascites, distension, hepatosplenomegaly.   MSK: No deformities or effusions. Neuro: Alert, extraocular movements intact, moves all extremities with normal strength and coordination, speech fluent Psych: Oriented to person, place, time, and situation.    Data Reviewed: I have personally reviewed following labs and imaging studies:  CBC: Recent Labs  Lab 11/30/19 1248 11/30/19 1251 12/01/19 1832  WBC 16.6*   --  18.2*  NEUTROABS 12.8*  --   --   HGB 16.4* 17.0* 16.4*  HCT 49.5* 50.0* 49.3*  MCV 88.4  --  90.6  PLT 328  --  297   Basic Metabolic Panel: Recent Labs  Lab 11/30/19 1248 11/30/19 1251 12/01/19 1832 12/01/19 2043  NA 139 140 146*  --   K 3.7 3.6 3.3*  --   CL 104 108 108  --   CO2 18*  --  24  --   GLUCOSE 337* 340* 130*  --   BUN --   CREATININE 1.01* 0.60 0.79  --   CALCIUM 9.9  --  9.9  --   MG  --   --   --  2.1  PHOS  --   --   --  3.7   GFR: Estimated Creatinine Clearance: 86.6 mL/min (by C-G formula based on SCr of 0.79 mg/dL). Liver Function Tests: Recent Labs  Lab 11/30/19 1248 12/01/19 1832  AST 18 22  ALT 19 18  ALKPHOS 87 92  BILITOT 1.2 1.3*  PROT 7.5 7.2  ALBUMIN 3.7 3.3*   No results for input(s): LIPASE, AMYLASE in the last 168 hours. No results for input(s): AMMONIA in the last 168 hours. Coagulation Profile: Recent Labs  Lab 11/30/19 1248  INR 1.1   Cardiac Enzymes: No results for input(s): CKTOTAL, CKMB, CKMBINDEX, TROPONINI in the last 168 hours. BNP (last 3 results) No results for input(s): PROBNP in the last 8760 hours. HbA1C: Recent Labs    12/01/19 0134 12/01/19 1832  HGBA1C >15.5* >15.5*   CBG: Recent Labs  Lab 12/02/19 0016 12/02/19 0347 12/02/19 0741 12/02/19 1109 12/02/19 1657  GLUCAP 237* 229* 321* 295* 263*   Lipid Profile: Recent Labs    12/01/19 0134  CHOL 180  HDL 31*  LDLCALC 125*  TRIG 122  CHOLHDL 5.8   Thyroid Function Tests: Recent Labs    12/01/19 0134 12/01/19 1832  TSH 0.249*  --   FREET4  --  0.92   Anemia Panel: No results for input(s): VITAMINB12, FOLATE, FERRITIN, TIBC, IRON, RETICCTPCT in the last 72 hours. Urine analysis: No results found for: COLORURINE, APPEARANCEUR, LABSPEC, PHURINE, GLUCOSEU, HGBUR, BILIRUBINUR, KETONESUR, PROTEINUR, UROBILINOGEN, NITRITE, LEUKOCYTESUR Sepsis Labs: (procalcitonin:4,lacticacidven:4)  ) Recent Results (from the past  240 hour(s))  Respiratory Panel by RT PCR (Flu A&B, Covid) - Nasopharyngeal Swab     Status: None   Collection Time: 11/30/19  1:25 PM   Specimen: Nasopharyngeal Swab  Result Value Ref Range Status   SARS Coronavirus 2 by RT PCR NEGATIVE NEGATIVE Final    Comment: (NOTE) SARS-CoV-2 target nucleic acids are NOT DETECTED.  The SARS-CoV-2 RNA is generally detectable in upper respiratoy specimens during the acute phase of infection. The lowest concentration of SARS-CoV-2  viral copies this assay can detect is 131 copies/mL. A negative result does not preclude SARS-Cov-2 infection and should not be used as the sole basis for treatment or other patient management decisions. A negative result may occur with  improper specimen collection/handling, submission of specimen other than nasopharyngeal swab, presence of viral mutation(s) within the areas targeted by this assay, and inadequate number of viral copies (<131 copies/mL). A negative result must be combined with clinical observations, patient history, and epidemiological information. The expected result is Negative.  Fact Sheet for Patients:  https://www.moore.com/https://www.fda.gov/media/142436/download  Fact Sheet for Healthcare Providers:  https://www.young.biz/https://www.fda.gov/media/142435/download  This test is no t yet approved or cleared by the [place] FDA and  has been authorized for detection and/or diagnosis of SARS-CoV-2 by FDA under an Emergency Use Authorization (EUA). This EUA will remain  in effect (meaning this test can be used) for the duration of the COVID-19 declaration under Section 564(b)(1) of the Act, 21 U.S.C. section 360bbb-3(b)(1), unless the authorization is terminated or revoked sooner.     Influenza A by PCR NEGATIVE NEGATIVE Final   Influenza B by PCR NEGATIVE NEGATIVE Final    Comment: (NOTE) The Xpert Xpress SARS-CoV-2/FLU/RSV assay is intended as an aid in  the diagnosis of influenza from Nasopharyngeal swab specimens and  should  not be used as a sole basis for treatment. Nasal washings and  aspirates are unacceptable for Xpert Xpress SARS-CoV-2/FLU/RSV  testing.  Fact Sheet for Patients: https://www.moore.com/https://www.fda.gov/media/142436/download  Fact Sheet for Healthcare Providers: https://www.young.biz/https://www.fda.gov/media/142435/download  This test is not yet approved or cleared by the [place] FDA and  has been authorized for detection and/or diagnosis of SARS-CoV-2 by  FDA under an Emergency Use Authorization (EUA). This EUA will remain  in effect (meaning this test can be used) for the duration of the  Covid-19 declaration under Section 564(b)(1) of the Act, 21  U.S.C. section 360bbb-3(b)(1), unless the authorization is  terminated or revoked. Performed at [facility], [address] [place], [place] [address]   Culture, blood (routine x 2)     Status: None (Preliminary result)   Collection Time: 11/30/19  3:06 PM   Specimen: BLOOD RIGHT WRIST  Result Value Ref Range Status   Specimen Description BLOOD RIGHT WRIST  Final   Special Requests   Final    BOTTLES DRAWN AEROBIC AND ANAEROBIC Blood Culture results may not be optimal due to an inadequate volume of blood received in culture bottles   Culture   Final    NO GROWTH 2 DAYS Performed at [facility], [address] [place], [place] [address]    Report Status PENDING  Incomplete  CSF culture     Status: None (Preliminary result)   Collection Time: 11/30/19  5:10 PM   Specimen: PATH Cytology CSF; Cerebrospinal Fluid  Result Value Ref Range Status   Specimen Description CSF  Final   Special Requests NONE  Final   Gram Stain   Final    WBC PRESENT,BOTH PMN AND MONONUCLEAR NO ORGANISMS SEEN CYTOSPIN SMEAR    Culture   Final    NO GROWTH 2 DAYS Performed at [facility], [address] [place], [place] [address]    Report Status PENDING  Incomplete  Culture, fungus without smear     Status: None (Preliminary result)   Collection Time: 11/30/19   5:10 PM   Specimen: PATH Cytology CSF; Cerebrospinal Fluid  Result Value Ref Range Status   Specimen Description CSF  Final   Special Requests NONE  Final   Culture   Final    NO FUNGUS ISOLATED AFTER 2 DAYS Performed at [facility], [address], [place], [place] [address]    Report Status PENDING  Incomplete  MRSA PCR Screening     Status: Abnormal   Collection Time: 12/01/19  7:00 AM   Specimen: Nasal Mucosa; Nasopharyngeal  Result Value Ref Range Status   MRSA by PCR POSITIVE (A) NEGATIVE Final    Comment:        The GeneXpert MRSA Assay (FDA approved for NASAL specimens only), is one component of a comprehensive MRSA colonization surveillance program. It is not intended to diagnose MRSA infection nor to guide or monitor treatment for MRSA infections. RESULT CALLED TO, READ BACK BY AND VERIFIED WITH: Joni Reining RN 10:40 12/01/19 ([email or web address]) Performed at [facility], [address], [place], [place] [address]          Radiology Studies: MR ANGIO HEAD WO CONTRAST  Result Date: 12/01/2019 CLINICAL DATA:  Seizures. Initial concern for stroke with possible left M2 occlusion on CTA. EXAM: MRA HEAD WITHOUT CONTRAST TECHNIQUE: Angiographic images of the Circle of Willis were obtained using MRA technique without intravenous contrast. COMPARISON:  Head and neck CTA 11/30/2019 FINDINGS: The visualized distal vertebral arteries are widely patent to the basilar with the left being mildly dominant. The left PICA and right AICA appear dominant. Patent SCAs are seen bilaterally. The basilar artery is widely patent. There are small left and likely diminutive right posterior communicating arteries. Both PCAs are patent with mild atherosclerotic type irregularity but no significant proximal stenosis. The internal carotid arteries are patent from skull base to carotid termini with moderate right and severe left cavernous segment stenoses as well as moderate proximal left supraclinoid  stenosis. ACAs and MCAs are patent without evidence of a proximal branch occlusion or significant proximal stenosis. A 3 mm aneurysm is again noted at the right MCA bifurcation involving the inferior M2 origin. IMPRESSION: 1. No large vessel occlusion. 2. Intracranial atherosclerosis including moderate right and severe left ICA stenoses. 3. 3 mm right MCA bifurcation aneurysm. Electronically Signed   By: Sebastian Ache M.D.   On: 12/01/2019 15:35   ECHOCARDIOGRAM COMPLETE  Result Date: 12/01/2019    ECHOCARDIOGRAM REPORT   Patient Name:   Darcus Austin Loser Date of Exam: 12/01/2019 Medical Rec #:  [id number]          Height:       65.0 in Accession #:    [id number]         Weight:       237.0 lb Date of Birth:  Dec 31, 1955          BSA:          2.126 m Patient Age:    64 years           BP:           106/61 mmHg Patient Gender: F                  HR:           65 bpm. Exam Location:  Inpatient Procedure: 2D Echo, Cardiac Doppler and Color Doppler Indications:    Stroke 434.91 / I163.9  History:        Patient has no prior history of Echocardiogram examinations.                 CAD, COPD;  Risk Factors:Hypertension.  Sonographer:    Elmarie Shiley Dance Referring Phys: [id number] JENNIFER YATES IMPRESSIONS  1. Left ventricular ejection fraction, by estimation, is 60 to 65%. The left ventricle has normal function. The left ventricle has no regional wall motion abnormalities. There is mild concentric left ventricular hypertrophy. Left ventricular diastolic parameters are consistent with Grade I diastolic dysfunction (impaired relaxation).  2. Right ventricular systolic function is normal. The right ventricular size is normal.  3. The mitral valve is normal in structure. No evidence of mitral valve regurgitation.  4. The aortic valve is tricuspid. Aortic valve regurgitation is trivial. Comparison(s): No prior Echocardiogram. Conclusion(s)/Recommendation(s): No intracardiac source of embolism detected on this transthoracic study. A  transesophageal echocardiogram is recommended to exclude cardiac source of embolism if clinically indicated. FINDINGS  Left Ventricle: Left ventricular ejection fraction, by estimation, is 60 to 65%. The left ventricle has normal function. The left ventricle has no regional wall motion abnormalities. The left ventricular internal cavity size was normal in size. There is  mild concentric left ventricular hypertrophy. Left ventricular diastolic parameters are consistent with Grade I diastolic dysfunction (impaired relaxation). Right Ventricle: The right ventricular size is normal. No increase in right ventricular wall thickness. Right ventricular systolic function is normal. Left Atrium: Left atrial size was normal in size. Right Atrium: Right atrial size was normal in size. Pericardium: There is no evidence of pericardial effusion. Mitral Valve: There is focal calcification of the posterior subvalvular apparatus. The mitral valve is normal in structure. Mild mitral annular calcification. No evidence of mitral valve regurgitation. Tricuspid Valve: The tricuspid valve is normal in structure. Tricuspid valve regurgitation is trivial. Aortic Valve: The aortic valve is tricuspid. Aortic valve regurgitation is trivial. Pulmonic Valve: The pulmonic valve was normal in structure. Pulmonic valve regurgitation is not visualized. Aorta: The aortic root is normal in size and structure. IAS/Shunts: No atrial level shunt detected by color flow Doppler.  LEFT VENTRICLE PLAX 2D LVIDd:         4.30 cm  Diastology LVIDs:         3.03 cm  LV e' medial:    5.98 cm/s LV PW:         1.21 cm  LV E/e' medial:  12.2 LV IVS:        1.13 cm  LV e' lateral:   5.77 cm/s LVOT diam:     1.90 cm  LV E/e' lateral: 12.6 LV SV:         67 LV SV Index:   31 LVOT Area:     2.84 cm  RIGHT VENTRICLE             IVC RV Basal diam:  2.77 cm     IVC diam: 1.64 cm RV S prime:     11.70 cm/s TAPSE (M-mode): 1.3 cm LEFT ATRIUM             Index       RIGHT  ATRIUM           Index LA diam:        3.20 cm 1.51 cm/m  RA Area:     12.20 cm LA Vol (A2C):   82.4 ml 38.76 ml/m RA Volume:   27.90 ml  13.12 ml/m LA Vol (A4C):   61.7 ml 29.02 ml/m LA Biplane Vol: 74.9 ml 35.23 ml/m  AORTIC VALVE LVOT Vmax:   119.00 cm/s LVOT Vmean:  79.200 cm/s LVOT VTI:    0.236 m  AORTA Ao  Root diam: 3.10 cm Ao Asc diam:  2.80 cm MITRAL VALVE MV Area (PHT): 2.62 cm     SHUNTS MV Decel Time: 289 msec     Systemic VTI:  0.24 m MV E velocity: 72.70 cm/s   Systemic Diam: 1.90 cm MV A velocity: 101.00 cm/s MV E/A ratio:  0.72 Laurance Flatten MD Electronically signed by Laurance Flatten MD Signature Date/Time: 12/01/2019/3:52:44 PM    Final         Scheduled Meds: . Chlorhexidine Gluconate Cloth  6 each Topical Q0600  . docusate sodium  100 mg Oral BID  . enoxaparin (LOVENOX) injection  40 mg Subcutaneous Q24H  . insulin aspart  0-15 Units Subcutaneous Q4H  . insulin glargine  14 Units Subcutaneous QHS  . lidocaine (PF)  2 mL Intradermal Once  . [START ON 12/03/2019] metFORMIN  500 mg Oral Q breakfast  . mupirocin ointment  1 application Nasal BID   Continuous Infusions: . acyclovir 770 mg (12/02/19 1100)     LOS: 2 days    Time spent: 25 minutes    Alberteen Sam, MD [facility] 12/02/2019, 5:21 PM     Please page though AMION or Epic secure chat:  For [employer], Higher education careers adviser

## 2019-12-03 DIAGNOSIS — I679 Cerebrovascular disease, unspecified: Secondary | ICD-10-CM

## 2019-12-03 LAB — GLUCOSE, CAPILLARY
Glucose-Capillary: 266 mg/dL — ABNORMAL HIGH (ref 70–99)
Glucose-Capillary: 235 mg/dL — ABNORMAL HIGH (ref 70–99)
Glucose-Capillary: 256 mg/dL — ABNORMAL HIGH (ref 70–99)

## 2019-12-03 LAB — MISC LABCORP TEST (SEND OUT): Labcorp test code: 139800

## 2019-12-03 LAB — VARICELLA-ZOSTER BY PCR: Varicella-Zoster, PCR: NEGATIVE

## 2019-12-03 MED ORDER — INSULIN STARTER KIT- PEN NEEDLES (ENGLISH)
1.0000 | Freq: Once | Status: DC
  Filled 2019-12-03: qty 1

## 2019-12-03 MED ORDER — INSULIN NPH (HUMAN) (ISOPHANE) 100 UNIT/ML ~~LOC~~ SUSP: 12.0000 [IU] | Freq: Two times a day (BID) | SUBCUTANEOUS | 11 refills | Status: AC

## 2019-12-03 MED ORDER — SIMVASTATIN 80 MG PO TABS
80.0000 mg | ORAL_TABLET | Freq: Every day | ORAL | 3 refills | Status: DC
Start: 2019-12-03 — End: 2019-12-03

## 2019-12-03 MED ORDER — CLOPIDOGREL BISULFATE 75 MG PO TABS: 75.0000 mg | ORAL_TABLET | Freq: Every day | ORAL | 3 refills | Status: AC

## 2019-12-03 MED ORDER — INSULIN GLARGINE 100 UNIT/ML ~~LOC~~ SOLN: 14.0000 [IU] | Freq: Every day | SUBCUTANEOUS | 11 refills | Status: DC

## 2019-12-03 MED ORDER — LIVING WELL WITH DIABETES BOOK
Freq: Once | Status: DC
  Filled 2019-12-03: qty 1

## 2019-12-03 MED ORDER — LEVETIRACETAM 500 MG PO TABS: 500.0000 mg | ORAL_TABLET | Freq: Two times a day (BID) | ORAL | 0 refills | Status: DC

## 2019-12-03 MED ORDER — LEVETIRACETAM 500 MG PO TABS
500.0000 mg | ORAL_TABLET | Freq: Two times a day (BID) | ORAL | Status: DC
  Administered 2019-12-03: 500 mg via ORAL
  Filled 2019-12-03: qty 1

## 2019-12-03 MED ORDER — FUROSEMIDE 20 MG PO TABS: 20.0000 mg | ORAL_TABLET | Freq: Every day | ORAL | 3 refills | Status: AC

## 2019-12-03 MED ORDER — SIMVASTATIN 40 MG PO TABS: 40.0000 mg | ORAL_TABLET | Freq: Every day | ORAL | 3 refills | Status: AC

## 2019-12-03 MED ORDER — INSULIN STARTER KIT- PEN NEEDLES (ENGLISH): 1.0000 | Freq: Once | Status: DC

## 2019-12-03 MED ORDER — FUROSEMIDE 20 MG PO TABS
20.0000 mg | ORAL_TABLET | Freq: Every day | ORAL | 3 refills | Status: DC
Start: 2019-12-03 — End: 2019-12-03

## 2019-12-03 MED ORDER — LEVETIRACETAM 500 MG PO TABS
500.0000 mg | ORAL_TABLET | Freq: Two times a day (BID) | ORAL | 0 refills | Status: DC
Start: 2019-12-03 — End: 2019-12-03

## 2019-12-03 MED ORDER — BLOOD GLUCOSE METER KIT: PACK | 0 refills | Status: AC

## 2019-12-03 MED ORDER — METFORMIN HCL 500 MG PO TABS: 500.0000 mg | ORAL_TABLET | Freq: Every day | ORAL | 3 refills | Status: AC

## 2019-12-03 MED ORDER — INSULIN SYRINGES (DISPOSABLE) U-100 0.5 ML MISC: 11 refills | Status: AC

## 2019-12-03 MED ORDER — METFORMIN HCL 500 MG PO TABS: 500.0000 mg | ORAL_TABLET | Freq: Every day | ORAL | 3 refills | Status: DC

## 2019-12-03 MED ORDER — CLOPIDOGREL BISULFATE 75 MG PO TABS
75.0000 mg | ORAL_TABLET | Freq: Every day | ORAL | 3 refills | Status: DC
Start: 2019-12-03 — End: 2019-12-03

## 2019-12-03 MED ORDER — HYDROCHLOROTHIAZIDE 25 MG PO TABS: 25.0000 mg | ORAL_TABLET | Freq: Every day | ORAL | 3 refills | Status: AC

## 2019-12-03 MED ORDER — HYDROCHLOROTHIAZIDE 25 MG PO TABS
25.0000 mg | ORAL_TABLET | Freq: Every day | ORAL | 3 refills | Status: DC
Start: 2019-12-03 — End: 2019-12-03

## 2019-12-03 MED ORDER — METOPROLOL TARTRATE 50 MG PO TABS: 50.0000 mg | ORAL_TABLET | Freq: Two times a day (BID) | ORAL | 3 refills | Status: AC

## 2019-12-03 MED ORDER — METOPROLOL TARTRATE 50 MG PO TABS
50.0000 mg | ORAL_TABLET | Freq: Two times a day (BID) | ORAL | 3 refills | Status: DC
Start: 2019-12-03 — End: 2019-12-03

## 2019-12-03 NOTE — Plan of Care (Signed)
Problem: Education: Goal: Knowledge of General Education information will improve Description: Including pain rating scale, medication(s)/side effects and non-pharmacologic comfort measures Outcome: Completed/Met   Problem: Health Behavior/Discharge Planning: Goal: Ability to manage health-related needs will improve Outcome: Completed/Met   Problem: Clinical Measurements: Goal: Ability to maintain clinical measurements within normal limits will improve Outcome: Completed/Met Goal: Will remain free from infection Outcome: Completed/Met Goal: Diagnostic test results will improve Outcome: Completed/Met Goal: Respiratory complications will improve Outcome: Completed/Met Goal: Cardiovascular complication will be avoided Outcome: Completed/Met   Problem: Activity: Goal: Risk for activity intolerance will decrease Outcome: Completed/Met   Problem: Nutrition: Goal: Adequate nutrition will be maintained Outcome: Completed/Met   Problem: Coping: Goal: Level of anxiety will decrease Outcome: Completed/Met   Problem: Elimination: Goal: Will not experience complications related to bowel motility Outcome: Completed/Met Goal: Will not experience complications related to urinary retention Outcome: Completed/Met   Problem: Pain Managment: Goal: General experience of comfort will improve Outcome: Completed/Met   Problem: Safety: Goal: Ability to remain free from injury will improve Outcome: Completed/Met   Problem: Skin Integrity: Goal: Risk for impaired skin integrity will decrease Outcome: Completed/Met   Problem: Acute Rehab PT Goals(only PT should resolve) Goal: Pt Will Go Supine/Side To Sit Outcome: Completed/Met Goal: Patient Will Transfer Sit To/From Stand Outcome: Completed/Met Goal: Pt Will Transfer Bed To Chair/Chair To Bed Outcome: Completed/Met Goal: Pt Will Ambulate Outcome: Completed/Met Goal: Pt Will Go Up/Down Stairs Outcome: Completed/Met   Problem:  Acute Rehab OT Goals (only OT should resolve) Goal: Pt. Will Perform Lower Body Bathing Outcome: Completed/Met Goal: Pt. Will Perform Lower Body Dressing Outcome: Completed/Met Goal: Pt. Will Transfer To Toilet Outcome: Completed/Met Goal: Pt. Will Perform Tub/Shower Transfer Outcome: Completed/Met Goal: Pt/Caregiver Will Perform Home Exercise Program Outcome: Completed/Met Goal: OT Additional ADL Goal #1 Outcome: Completed/Met  Discharge instructions reviewed with patient and with husband.  These included, but were not limited to, the following:  follow-up appointments with PCP of choice, medication education, medication history with explanation of new medications, living with diabetes, review of insulin (it's purpose, administration, return demonstration of insulin, etc), when to call the MD, importance of checking blood sugar, and prevention of associated complications (I.e. visual problems,urinary problems, stroke, cardiovascular issues, etc).  Patient was assured that this was a lot of information to digest at one time.  Received earlier phone call for diabetic coordinator at [facility], which the patient found very helpful.  The rational behind treatment of DM and prevention of potential complications was stressed as well as how to administer insulin injections to one's-self.  Questions were answered to the satisfaction of the patient AEB use of "teach-back" technique.  Patient was assured that this was entirely manageable and resources were given to her for OP information.  She will be followed closely by her chosen PCP for further management of her DM going forward.

## 2019-12-03 NOTE — Progress Notes (Signed)
Neurology Progress Note  S: Patient seen and examined no new complaints no overnight events  O: Current vital signs: BP 120/83 (BP Location: Left Arm)   Pulse 95   Temp 98.1 F (36.7 C) (Axillary)   Resp (!) 9   Ht 5\' 5"  (1.651 m)   Wt 107.5 kg   SpO2 97%   BMI 39.44 kg/m  Vital signs in last 24 hours: Temp:  [97.8 F (36.6 C)-98.6 F (37 C)] 98.1 F (36.7 C) (10/02 0700) Pulse Rate:  [74-95] 95 (10/02 0700) Resp:  [9-20] 9 (10/02 0700) BP: (111-128)/(67-87) 120/83 (10/02 0700) SpO2:  [91 %-97 %] 97 % (10/02 0700) GENERAL: Awake, alert in NAD HEENT: Normocephalic and atraumatic, dry mm, no LN++, no Thyromegally LUNGS: Clear to auscultation bilaterally with no wheezes CV: S1S2 RRR, no m/r/g, equal pulses bilaterally. ABDOMEN: Soft, nontender, nondistended with normoactive BS Ext: warm, well perfused, intact peripheral pulses  NEURO:  Mental Status: AA&Ox3  Language: speech is normal.  Naming, repetition, fluency, and comprehension intact. Cranial Nerves: PERRL equal and brisk. EOMI, visual fields full, no facial asymmetry, facial sensation intact, hearing intact, tongue/uvula/soft palate midline, normal sternocleidomastoid and trapezius muscle strength. No evidence of tongue atrophy or fibrillations Motor: Good strength throughout Tone: is normal and bulk is normal Sensation- Intact to light touch bilaterally Coordination: FTN intact bilaterally, no ataxia in BLE. Gait- deferred  NIHSS 0  Medications  Current Facility-Administered Medications:  .  acetaminophen (TYLENOL) tablet 650 mg, 650 mg, Oral, Q4H PRN **OR** acetaminophen (TYLENOL) suppository 650 mg, 650 mg, Rectal, Q4H PRN, 04-27-1993, MD .  albuterol (VENTOLIN HFA) 108 (90 Base) MCG/ACT inhaler 2 puff, 2 puff, Inhalation, Q4H PRN, Jonah Blue, MD .  Chlorhexidine Gluconate Cloth 2 % PADS 6 each, 6 each, Topical, Q0600, Danford, Jonah Blue, MD, 6 each at 12/03/19 0715 .  docusate sodium (COLACE)  capsule 100 mg, 100 mg, Oral, BID, 02/02/20, MD, 100 mg at 12/03/19 0954 .  enoxaparin (LOVENOX) injection 40 mg, 40 mg, Subcutaneous, Q24H, 02/02/20, MD, 40 mg at 12/02/19 1730 .  hydrALAZINE (APRESOLINE) injection 5 mg, 5 mg, Intravenous, Q4H PRN, 02/01/20, MD .  insulin aspart (novoLOG) injection 0-15 Units, 0-15 Units, Subcutaneous, Q4H, Danford, 05-[birth year]-1984, MD, 5 Units at 12/03/19 0900 .  insulin glargine (LANTUS) injection 14 Units, 14 Units, Subcutaneous, QHS, Danford, 02/02/20, MD, 14 Units at 12/02/19 2249 .  levETIRAcetam (KEPPRA) tablet 500 mg, 500 mg, Oral, BID, Danford, Christopher P, MD .  lidocaine (PF) (XYLOCAINE) 1 % injection 2 mL, 2 mL, Intradermal, Once, 02/01/20, MD .  LORazepam (ATIVAN) injection 1-2 mg, 1-2 mg, Intravenous, Q2H PRN, Tilden Fossa, MD .  metFORMIN (GLUCOPHAGE) tablet 500 mg, 500 mg, Oral, Q breakfast, Danford, Jonah Blue, MD, 500 mg at 12/03/19 0954 .  mupirocin ointment (BACTROBAN) 2 % 1 application, 1 application, Nasal, BID, Danford, 02/02/20, MD, 1 application at 12/03/19 [phone] .  ondansetron (ZOFRAN) tablet 4 mg, 4 mg, Oral, Q6H PRN **OR** ondansetron (ZOFRAN) injection 4 mg, 4 mg, Intravenous, Q6H PRN, 1601, MD .  polyethylene glycol (MIRALAX / GLYCOLAX) packet 17 g, 17 g, Oral, Daily PRN, Jonah Blue, MD Labs     Component Value Date/Time   WBC 18.2 (H) 12/01/2019 1832   RBC 5.44 (H) 12/01/2019 1832   HGB 16.4 (H) 12/01/2019 1832   HCT 49.3 (H) 12/01/2019 1832   PLT 297 12/01/2019 1832   MCV 90.6 12/01/2019 1832   MCH 30.1 12/01/2019  1832   MCHC 33.3 12/01/2019 1832   RDW 14.6 12/01/2019 1832   LYMPHSABS 2.5 11/30/2019 1248   MONOABS 1.2 (H) 11/30/2019 1248   EOSABS 0.0 11/30/2019 1248   BASOSABS 0.1 11/30/2019 1248       Component Value Date/Time   NA 146 (H) 12/01/2019 1832   K 3.3 (L) 12/01/2019 1832   CL 108 12/01/2019 1832   CO2 24 12/01/2019 1832   GLUCOSE 130 (H)  12/01/2019 1832   BUN 19 12/01/2019 1832   CREATININE 0.79 12/01/2019 1832   CALCIUM 9.9 12/01/2019 1832   PROT 7.2 12/01/2019 1832   ALBUMIN 3.3 (L) 12/01/2019 1832   AST 22 12/01/2019 1832   ALT 18 12/01/2019 1832   ALKPHOS 92 12/01/2019 1832   BILITOT 1.3 (H) 12/01/2019 1832   GFRNONAA >60 12/01/2019 1832   GFRAA >60 12/01/2019 1832      Component Value Date/Time   CHOL 180 12/01/2019 0134   TRIG 122 12/01/2019 0134   HDL 31 (L) 12/01/2019 0134   CHOLHDL 5.8 12/01/2019 0134   VLDL 24 12/01/2019 0134   LDLCALC 125 (H) 12/01/2019 0134   Assessment: 64 y.o. woman history COPD; CAD with stents; HTN presented with R-sided weakness, seizure became febrile. CSF was unrevealing however encephalitis is very likely. No further seizure. Patient is significantly improved.  Impression/plan: Acute encephalitis unclear etiology (husband states she had just been diagnosed with sinusitis) - Seizure have resolved - Continue levetiracetam 500mg  two times daily for now. Cerebrovascular disease - continue clopidogrel, blood pressure goal <140/90 (DM <130/80), risk factor control. New onset DM 2 - establish care with PCP. Follow up with outpatient neurology  Please call for questions.  , MD Page: Marisue Humble

## 2019-12-03 NOTE — Evaluation (Signed)
Occupational Therapy Evaluation Patient Details Name: Julia Medina MRN: [id number] DOB: 1955/03/26 Today's Date: 12/03/2019    History of Present Illness 64 y.o. female with medical history significant of COPD; CAD with stents; and HTN presenting with R-sided weakness and seizure. Pt underwent LP on 11/30/2019. 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   Clinical Impression   PTA pt living with spouse and functioning at independent level. At time of eval, pt requiring min guard-min A for functional transfer/mobility with RW to maintain balance. Pt able to mobilize to sink in bathroom to engage in x2 grooming tasks. During this time, pt required at least 1UE support on counter to maintain balance. Without UE support pt required intermittent min A to maintain safety. Noted cognitive deficits in attention, memory, safety, and orientation. Pt not able to recall events that lead up to hospitalization and has poor safety awareness as to how her actions could result in falls. Reviewed fall prevention strategies for home. Also issued pt level one theraband exercise program due to generalized RUE weakness. Given current status, recommend HHOT at d/c for continue progression of ADL safety in home environment. Will continue to follow per POC listed below.    Follow Up Recommendations  Home health OT    Equipment Recommendations  Tub/shower seat    Recommendations for Other Services       Precautions / Restrictions Precautions Precautions: Fall;Other (comment) Precaution Comments: seizure Restrictions Weight Bearing Restrictions: No      Mobility Bed Mobility Overal bed mobility: Needs Assistance Bed Mobility: Sit to Supine     Supine to sit: Supervision;HOB elevated Sit to supine: Supervision   General bed mobility comments: +rail, increased time  Transfers Overall transfer level: Needs assistance Equipment used: Rolling walker (2 wheeled) Transfers: Sit to/from  [employer] Sit to Stand: Min guard Stand pivot transfers: Min guard       General transfer comment: cues for hand placement, increased time    Balance Overall balance assessment: Needs assistance Sitting-balance support: No upper extremity supported;Feet supported Sitting balance-Leahy Scale: Good     Standing balance support: Bilateral upper extremity supported;During functional activity Standing balance-Leahy Scale: Poor Standing balance comment: reliant on UE support. When standing at sink to brush teeth, pt removed hands from coutertop and needed min A to correct balance                           ADL either performed or assessed with clinical judgement   ADL Overall ADL's : Needs assistance/impaired Eating/Feeding: Set up;Sitting   Grooming: Min guard;Minimal assistance;Standing;Oral care Grooming Details (indicate cue type and reason): intermittent min A to maintain balance when standing at sink. When pt removes hands off of sink requires assist to maintain upright balance Upper Body Bathing: Set up;Sitting   Lower Body Bathing: Minimal assistance;Sit to/from stand Lower Body Bathing Details (indicate cue type and reason): simulated with applicaiton of lotion to BLEs. Pt able to achieve task with supervision while sitting but requiring min A to stand and maintain balance Upper Body Dressing : Set up;Sitting   Lower Body Dressing: Minimal assistance;Sit to/from stand   Toilet Transfer: Minimal assistance;Ambulation;RW Toilet Transfer Details (indicate cue type and reason): assist to maintain steadiness while mobilizing to toilet Toileting- Clothing Manipulation and Hygiene: Set up;Sit to/from stand       Functional mobility during ADLs: Minimal assistance;Rolling walker       Vision Baseline Vision/History: No visual deficits Patient Visual  Report: No change from baseline Vision Assessment?: No apparent visual deficits     Perception      Praxis      Pertinent Vitals/Pain Pain Assessment: No/denies pain     Hand Dominance Right   Extremity/Trunk Assessment Upper Extremity Assessment Upper Extremity Assessment: RUE deficits/detail;Generalized weakness RUE Deficits / Details: grossl4 3/5 with some tingling sensation. Pt is able to use hand functionally, but may require increased attempts/time due to dropping items   Lower Extremity Assessment Lower Extremity Assessment: Defer to PT evaluation       Communication Communication Communication: No difficulties   Cognition Arousal/Alertness: Awake/alert Behavior During Therapy: WFL for tasks assessed/performed Overall Cognitive Status: Impaired/Different from baseline Area of Impairment: Orientation;Memory;Safety/judgement;Problem solving;Awareness                 Orientation Level: Time   Memory: Decreased short-term memory   Safety/Judgement: Decreased awareness of safety;Decreased awareness of deficits Awareness: Emergent Problem Solving: Slow processing;Difficulty sequencing;Requires verbal cues General Comments: Pt remains somewhat disoriented/confused but is increasing in awareness of deficits and state when/if she feels confused. Pt not able to recall situation. Overall decreased awareness of safety leading to risk of falls (losing balance and not wanting use walker unless cued)   General Comments  VSS on RA    Exercises     Shoulder Instructions      Home Living Family/patient expects to be discharged to:: Private residence Living Arrangements: Spouse/significant other;Non-relatives/Friends Available Help at Discharge: Family;Available 24 hours/day;Friend(s) Type of Home: House Home Access: Stairs to enter [employer] of Steps: 1 Entrance Stairs-Rails: None Home Layout: One level     Bathroom Shower/Tub: Chief Strategy Officer: Standard     Home Equipment: Environmental consultant - 2 wheels          Prior  Functioning/Environment Level of Independence: Independent        Comments: pt does not drive, but otherwise is independent in ADL- walks around without AD        OT Problem List: Decreased strength;Decreased knowledge of use of DME or AE;Decreased knowledge of precautions;Decreased activity tolerance;Decreased cognition;Impaired balance (sitting and/or standing);Decreased safety awareness      OT Treatment/Interventions: Self-care/ADL training;Therapeutic exercise;Patient/family education;Balance training;Energy conservation;Therapeutic activities;DME and/or AE instruction    OT Goals(Current goals can be found in the care plan section) Acute Rehab OT Goals Patient Stated Goal: home OT Goal Formulation: With patient Time For Goal Achievement: 12/17/19 Potential to Achieve Goals: Good  OT Frequency: Min 2X/week   Barriers to D/C:            Co-evaluation              AM-PAC OT "6 Clicks" Daily Activity     Outcome Measure Help from another person eating meals?: A Little Help from another person taking care of personal grooming?: A Little Help from another person toileting, which includes using toliet, bedpan, or urinal?: A Little Help from another person bathing (including washing, rinsing, drying)?: A Little Help from another person to put on and taking off regular upper body clothing?: A Little Help from another person to put on and taking off regular lower body clothing?: A Little 6 Click Score: 18   End of Session Equipment Utilized During Treatment: Gait belt;Rolling walker Nurse Communication: Mobility status  Activity Tolerance: Patient tolerated treatment well Patient left: in bed;with call bell/phone within reach;with bed alarm set  OT Visit Diagnosis: Unsteadiness on feet (R26.81);Other abnormalities of gait and mobility (R26.89);Other symptoms  and signs involving the nervous system (R29.898);Other symptoms and signs involving cognitive function                 Time: 1610-9604 OT Time Calculation (min): 27 min Charges:  OT General Charges $OT Visit: 1 Visit OT Evaluation $OT Eval Moderate Complexity: 1 Mod OT Treatments $Self Care/Home Management : 8-22 mins  Dalphine Handing, MSOT, OTR/L Acute Rehabilitation Services [facility] Office Number: [phone] Pager: [phone]  Dalphine Handing 12/03/2019, 12:13 PM

## 2019-12-03 NOTE — TOC Transition Note (Addendum)
Transition of Care [facility]) - CM/SW Discharge Note   Patient Details  Name: Julia Medina MRN: [id number] Date of Birth: 1956/01/01  [facility] CM/SW Contact:  Deveron Furlong, RN 12/03/2019, 11:20 AM   Clinical Narrative:    Patient uninsured and will d/c home with husband.  Patient needs prescription assistance and agrees to pay $3/each ($24 total) for scripts with [facility] program.  Patient agrees to f/u with clinic for prescription assistance. Resources provided.  Frances Furbish is charity [facility] provider and accepts patient referral for OT.  Patient declines 3n1.   Final next level of care: Home w Home Health Services Barriers to Discharge: No Barriers Identified    Discharge Plan and Services      HH Arranged: PT [facility] Agency: [facility] Date [facility] Agency Contacted: 12/03/19 Time HH Agency Contacted: [phone] Representative spoke with at [facility] Agency: Kandee Keen

## 2019-12-03 NOTE — Discharge Summary (Signed)
Physician Discharge Summary  Julia Medina:[id number] DOB: 1955-05-27 DOA: 11/30/2019  PCP: Patient, No Pcp Per  Admit date: 11/30/2019 Discharge date: 12/03/2019  Admitted From: Home  Disposition:  Home with [facility]   Recommendations for Outpatient Follow-up:  1. Follow up with new PCP as soon as able for new onset diabetes 2. 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 F with CAD s/p PCIx2 last 2019, HTN, smoking, COPD not on O2 who presented with confusion, fever, seizures.  Patient had been in usual state of health until few days prior to admission when she was not feeling well. Was diagnosed with "sinusitis" and then progressively felt worse, fever.  Overnight the night of admission she started to have right arm and leg shaking with loss of consciousness brought to the ER.  In the ER had witnessed seizure, fever, tachycardia.      PRINCIPAL HOSPITAL DIAGNOSIS: Viral encephalitis    Discharge Diagnoses:   Viral encephalitis Admitted and started on broad-spectrum antibiotics and acyclovir.  LP showed 26 WBC/hpf only in tube 4, bacterial meningitis ruled out, antibiotics stopped and the patient continued to improve.  She had no further seizures.      Given her preceding URI symptoms, headache prior to admission and presentation with mental status changes/seizure, suspect this was a viral encephalitis or meningoencephalitis.  She was evaluated by Neurology and ID. HSV PCR negative.  Acyclovir stopped.    The patient had continued improvement, was evaluated by PT and recommended for [facility] PT and supportive care from family.    Coronary disease secondary prevention Hypertension Continue Plavix, Lasix, HCTZ, metoprolol,  Zocor  New onset Diabetes Previously not diagnosed, hemoglobin A1c greater than 15%.  Started on NPH, metformin.  Given extensive diabetic teaching, diet education.  Trained on glucometer.  Recommended to establish with PCP as soon as possible.  COPD Smoking No evidence of COPD flare  Euthyroid sick         Discharge Instructions  Discharge Instructions    Ambulatory referral to Neurology   Complete by: As directed    An appointment is requested in approximately: 4 weeks   Diet - low sodium heart healthy   Complete by: As directed    Discharge instructions   Complete by: As directed    From Dr. Loleta Books: You were admitted for a brain infection We believe this was a virus that caused "encephalitis" and seizures.  We tested you for all the types of encephalitis that are treatable, and these tests were normal, so we believe it was a virus that just got better by itself, and doesn't require further treatment.  For the seizures: Take levetiracetam/Keppra 500 mg twice daily  Go see a brain specialist at [place] in 2 weeks.  Call their office to confirm the time of your appointment (see the number below in the To Do section) They will be able to safely stop the [place].   DO NOT DRIVE for six months from now.  It is against [place] law to drive within six months of having a seizure.   Resume all your normal home medicines.    For the new diabetes: Start taking metformin (the medicine for diabetes) Take metformin 500 mg (1 tablet) once daily  Also, take Lantus insulin 14 units once daily  Check your blood sugar every morning. A "normal" blood sugar in the morning when you wake up is between 70 and 120  A "normal" blood sugar after eating is 120 to 180  If your blood sugar is ever less than 70, you might have symptoms of shakes, fatigue, nausea, dizziness.  If this happens, drink some juice and check your sugar again  in 15 minutes.  If the symptoms don't go away, go to the ER.  If your sugar is every greater than 400, go to Urgent Care   You must go get a new primary care doctor. I recommend either [facility] (number listed below in the To Do Section) or [facility] in [place] [phone]   Increase activity slowly   Complete by: As directed      Allergies as of 12/03/2019      Reactions   Aspirin Rash   Penicillins Rash   Sulfa Antibiotics Rash      Medication List    TAKE these medications   acetaminophen 650 MG CR tablet Commonly known as: TYLENOL Take 650-1,300 mg by mouth every 8 (eight) hours as needed for pain.   albuterol 108 (90 Base) MCG/ACT inhaler Commonly known as: VENTOLIN HFA Inhale 2 puffs into the lungs every 4 (four) hours as needed for wheezing or shortness of breath.   blood glucose meter kit and supplies Dispense based on patient and insurance preference. Check blood sugar once daily. (FOR ICD-10 E10.9, E11.9).   clopidogrel 75 MG tablet Commonly known as: PLAVIX Take 1 tablet (75 mg total) by mouth daily.   furosemide 20 MG tablet Commonly known as: LASIX Take 1 tablet (20 mg total) by mouth daily.   hydrochlorothiazide 25 MG tablet Commonly known as: HYDRODIURIL Take 1 tablet (25 mg total) by mouth daily.   insulin glargine 100 UNIT/ML injection Commonly known as: LANTUS Inject 0.14 mLs (14 Units total) into the skin at bedtime.   levETIRAcetam 500 MG tablet Commonly known as: KEPPRA Take 1 tablet (500 mg total) by mouth 2 (two) times daily.   metFORMIN 500 MG tablet Commonly known as: GLUCOPHAGE Take 1 tablet (500 mg total) by mouth daily with breakfast. Start taking on: December 04, 2019   metoprolol tartrate 50 MG tablet Commonly known as: LOPRESSOR Take 1 tablet (50 mg total) by mouth 2 (two) times daily.   omeprazole 20 MG capsule Commonly known as: PRILOSEC Take 20 mg by mouth daily.    simvastatin 80 MG tablet Commonly known as: ZOCOR Take 1 tablet (80 mg total) by mouth at bedtime.            Durable Medical Equipment  (From admission, onward)         Start     Ordered   12/03/19 0907  DME 3-in-1  (Discharge Planning)  Once        12/03/19 9381          Follow-up Information    [facility]. Schedule an appointment as soon as possible for a visit in 2 week(s).   Contact information: [address]     [address] [place] [address] [place]. Schedule an appointment as soon as possible for a visit in 1 week(s).   Contact information: [address] [place] [address] [phone]  Allergies  Allergen Reactions  . Aspirin Rash  . Penicillins Rash  . Sulfa Antibiotics Rash    Consultations:  Neurology  ID   Procedures/Studies: MR ANGIO HEAD WO CONTRAST  Result Date: 12/01/2019 CLINICAL DATA:  Seizures. Initial concern for stroke with possible left M2 occlusion on CTA. EXAM: MRA HEAD WITHOUT CONTRAST TECHNIQUE: Angiographic images of the Circle of Willis were obtained using MRA technique without intravenous contrast. COMPARISON:  Head and neck CTA 11/30/2019 FINDINGS: The visualized distal vertebral arteries are widely patent to the basilar with the left being mildly dominant. The left PICA and right AICA appear dominant. Patent SCAs are seen bilaterally. The basilar artery is widely patent. There are small left and likely diminutive right posterior communicating arteries. Both PCAs are patent with mild atherosclerotic type irregularity but no significant proximal stenosis. The internal carotid arteries are patent from skull base to carotid termini with moderate right and severe left cavernous segment stenoses as well as moderate proximal left supraclinoid stenosis. ACAs and MCAs are patent without evidence of a proximal  branch occlusion or significant proximal stenosis. A 3 mm aneurysm is again noted at the right MCA bifurcation involving the inferior M2 origin. IMPRESSION: 1. No large vessel occlusion. 2. Intracranial atherosclerosis including moderate right and severe left ICA stenoses. 3. 3 mm right MCA bifurcation aneurysm. Electronically Signed   By: Logan Bores M.D.   On: 12/01/2019 15:35   MR BRAIN WO CONTRAST  Result Date: 11/30/2019 CLINICAL DATA:  64 year old female code stroke presentation, with seizure in the CT suite. CTA suspicious for left MCA ELVO, but negative CT Perfusion and evidence of underlying intracranial atherosclerosis. EXAM: MRI HEAD WITHOUT CONTRAST TECHNIQUE: Multiplanar, multiecho pulse sequences of the brain and surrounding structures were obtained without intravenous contrast. COMPARISON:  CT head, CTP and CTA today. FINDINGS: Study limited to DWI, axial FLAIR and T2* imaging at the request of neurology. Diffusion-weighted imaging is mildly motion degraded, but both axial and coronal DWI appear negative for restricted diffusion or acute infarct. Also, no hippocampal formation diffusion abnormality is identified. Motion degraded but otherwise unremarkable FLAIR imaging. T2* also appears negative. No intracranial mass effect. No ventriculomegaly. Initially intracranial MRA was also planned but the examination had to be discontinued prior to completion. IMPRESSION: No evidence of acute ischemia. This study was discussed both with NIR, and also Dr. Lynnae Sandhoff at 1420 and 1430 hours respectively. We agreed the constellation favors intracranial atherosclerosis over emergent large vessel occlusion. And although there are no DWI findings of status epilepticus, sequelae of seizure is still possible. Electronically Signed   By: Genevie Ann M.D.   On: 11/30/2019 14:51   CT CEREBRAL PERFUSION W CONTRAST  Result Date: 11/30/2019 CLINICAL DATA:  64 year old female with right side hemiparesis and leftward  gaze. Had a seizure in the CT suite. Last known well 0130 hours. Negative plain head CT at 1257 hours today. EXAM: CT ANGIOGRAPHY HEAD AND NECK CT PERFUSION BRAIN TECHNIQUE: Multidetector CT imaging of the head and neck was performed using the standard protocol during bolus administration of intravenous contrast. Multiplanar CT image reconstructions and MIPs were obtained to evaluate the vascular anatomy. Carotid stenosis measurements (when applicable) are obtained utilizing NASCET criteria, using the distal internal carotid diameter as the denominator. Multiphase CT imaging of the brain was performed following IV bolus contrast injection. Subsequent parametric perfusion maps were calculated using RAPID software. CONTRAST:  112m OMNIPAQUE IOHEXOL 350 MG/ML SOLN COMPARISON:  Head CT 1257 hours today. FINDINGS: CT Brain  Perfusion Findings: ASPECTS: 10 CBF (<30%) Volume: 33m Perfusion (Tmax>6.0s) volume: 05mMismatch Volume: Not applicable Infarction Location:No infarct core detected. No CBF or CBV abnormality. There is 79 mL of T-max greater than 4 seconds which is mostly but not completely in the posterior left MCA territory. CTA NECK Skeleton: Absent maxillary dentition. No acute osseous abnormality identified. Upper chest: Negative. Other neck: Mild thyroid enlargement. No discrete thyroid nodule. Upper limits of normal to mildly enlarged bilateral level 1B/2A lymph nodes, up to 9 mm short axis and slightly greater on the left. No cystic or necrotic nodes are evident. Other neck soft tissue contours are within normal limits. Aortic arch: Aberrant origin of the right subclavian artery, with shared origin of the CCA is. Three vessel arch configuration. Mild arch atherosclerosis. Right carotid system: Mildly tortuous proximal right CCA. No stenosis proximal to the bifurcation. Minimal plaque at the bifurcation. No cervical right ICA stenosis. Left carotid system: Mildly tortuous proximal left CCA with no plaque or  stenosis. Mild soft and calcified plaque at the lateral left ICA origin without stenosis to the skull base. 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 Normal right vertebral artery origin. Mildly tortuous right V1 segment. Mildly non dominant right vertebral artery. Tortuosity at the C3 level. Patent right vertebral to the skull base without plaque or stenosis. Minimal plaque in the proximal left subclavian artery without stenosis. Normal left vertebral artery origin. Mildly dominant and tortuous left vertebral artery is patent to the skull base without plaque or stenosis. CTA HEAD Suboptimal intracranial IV contrast bolus. Posterior circulation: Normal V4 segments, the left is mildly dominant. Normal left PICA origin. The right AICA appears dominant and patent. Patent vertebrobasilar junction and basilar artery without stenosis. Patent SCA and PCA origins. Posterior communicating arteries are diminutive or absent. Both PCA P1 and P2 segments are patent. Both PCA bifurcations appear patent but there is asymmetrically decreased distal left PCA flow on the left (series 10, image 23). No discrete PCA branch enhancement is evident. Anterior circulation: Both ICA siphons are patent. On the right there is moderate siphon calcified plaque and moderate to severe stenosis just distal to the anterior genu (series 6, image 90). The right ICA terminus remains patent. On the left heavily calcified distal cavernous and supraclinoid ICA also, with moderate to severe supraclinoid stenosis as well (series 5, image 88). Patent left carotid terminus.  Patent MCA and ACA origins. Diminutive and irregular ACA A1 appearance a especially on the right where moderate to severe stenosis is noted on series 9, image 21. But no ACA branch occlusion is identified. Right MCA M1 segment bifurcates early without stenosis, but there is a 3 mm aneurysm directed inferiorly at the  MCA bifurcation (series 9, image 2). No right M2 occlusion is identified. On the left side the M1 is patent to the bifurcation and tortuous (series 8, image 18). At the bifurcation there are asymmetrically fewer enhancing left MCA branches suggesting 1 or more M2 occlusions (see series 10, image 27 versus series 10, image 13 on the right). And M2 occlusion is suspected on the source images series 7, image 117. But other MCA branch detail is limited. Venous sinuses: Not evaluated due to early/suboptimal contrast. Anatomic variants: Aberrant origin right subclavian artery. Shared CCA origin. Review of the MIP images confirms the above findings IMPRESSION: 1. 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  2. Furthermore, the intracranial contrast bolus is suboptimal and there is intracranial atherosclerosis, including up to Severe bilateral Supraclinoid ICA stenoses. 3. There is also asymmetric decreased enhancement of the distal left PCA branches. 4. 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 5. But minimal atherosclerosis and no arterial stenosis in the neck. 6. Mild nonspecific level 2 cervical lymphadenopathy. [facility] and CTP findings reviewed in person with Dr. Theda Sers and [place] on 11/30/2019 at 1323 hours. And in discussion that included NIR, decision was made for follow-up stat Brain MRI (to include at least DWI and FLAIR) and intracranial MRA to augment the decision making in this case. Electronically Signed   By: Genevie Ann M.D.   On: 11/30/2019 13:40   DG Chest Port 1 View  Result Date: 11/30/2019 CLINICAL DATA:  Altered mental status. EXAM: PORTABLE CHEST 1 VIEW COMPARISON:  December 02, 2016. FINDINGS: Stable cardiomediastinal silhouette. Bilateral interstitial opacities are noted which may represent chronic scarring or interstitial lung disease. Acute superimposed edema or inflammation cannot  be excluded. No pneumothorax or pleural effusion is noted. Bony thorax is unremarkable. IMPRESSION: Bilateral interstitial opacities are noted which may represent chronic scarring or interstitial lung disease. Acute superimposed edema or inflammation cannot be excluded. Electronically Signed   By: Marijo Conception M.D.   On: 11/30/2019 13:45   ECHOCARDIOGRAM COMPLETE  Result Date: 12/01/2019    ECHOCARDIOGRAM REPORT   Patient Name:   Maryln Manuel Leary Date of Exam: 12/01/2019 Medical Rec #:  [id number]          Height:       65.0 in Accession #:    [id number]         Weight:       237.0 lb Date of Birth:  [birth year]          BSA:          2.126 m Patient Age:    64 years           BP:           106/61 mmHg Patient Gender: F                  HR:           65 bpm. Exam Location:  Inpatient Procedure: 2D Echo, Cardiac Doppler and Color Doppler Indications:    Stroke 434.91 / I163.9  History:        Patient has no prior history of Echocardiogram examinations.                 CAD, COPD; Risk Factors:Hypertension.  Sonographer:    Jonelle Sidle Dance Referring Phys: [place]  1. Left ventricular ejection fraction, by estimation, is 60 to 65%. The left ventricle has normal function. The left ventricle has no regional wall motion abnormalities. There is mild concentric left ventricular hypertrophy. Left ventricular diastolic parameters are consistent with Grade I diastolic dysfunction (impaired relaxation).  2. Right ventricular systolic function is normal. The right ventricular size is normal.  3. The mitral valve is normal in structure. No evidence of mitral valve regurgitation.  4. The aortic valve is tricuspid. Aortic valve regurgitation is trivial. Comparison(s): No prior Echocardiogram. Conclusion(s)/Recommendation(s): No intracardiac source of embolism detected on this transthoracic study. A transesophageal echocardiogram is recommended to exclude cardiac source of embolism if clinically indicated.  FINDINGS  Left Ventricle: Left ventricular ejection fraction, by estimation, is 60 to 65%. The left  ventricle has normal function. The left ventricle has no regional wall motion abnormalities. The left ventricular internal cavity size was normal in size. There is  mild concentric left ventricular hypertrophy. Left ventricular diastolic parameters are consistent with Grade I diastolic dysfunction (impaired relaxation). Right Ventricle: The right ventricular size is normal. No increase in right ventricular wall thickness. Right ventricular systolic function is normal. Left Atrium: Left atrial size was normal in size. Right Atrium: Right atrial size was normal in size. Pericardium: There is no evidence of pericardial effusion. Mitral Valve: There is focal calcification of the posterior subvalvular apparatus. The mitral valve is normal in structure. Mild mitral annular calcification. No evidence of mitral valve regurgitation. Tricuspid Valve: The tricuspid valve is normal in structure. Tricuspid valve regurgitation is trivial. Aortic Valve: The aortic valve is tricuspid. Aortic valve regurgitation is trivial. Pulmonic Valve: The pulmonic valve was normal in structure. Pulmonic valve regurgitation is not visualized. Aorta: The aortic root is normal in size and structure. IAS/Shunts: No atrial level shunt detected by color flow Doppler.  LEFT VENTRICLE PLAX 2D LVIDd:         4.30 cm  Diastology LVIDs:         3.03 cm  LV e' medial:    5.98 cm/s LV PW:         1.21 cm  LV E/e' medial:  12.2 LV IVS:        1.13 cm  LV e' lateral:   5.77 cm/s LVOT diam:     1.90 cm  LV E/e' lateral: 12.6 LV SV:         67 LV SV Index:   31 LVOT Area:     2.84 cm  RIGHT VENTRICLE             IVC RV Basal diam:  2.77 cm     IVC diam: 1.64 cm RV S prime:     11.70 cm/s TAPSE (M-mode): 1.3 cm LEFT ATRIUM             Index       RIGHT ATRIUM           Index LA diam:        3.20 cm 1.51 cm/m  RA Area:     12.20 cm LA Vol (A2C):   82.4 ml 38.76  ml/m RA Volume:   27.90 ml  13.12 ml/m LA Vol (A4C):   61.7 ml 29.02 ml/m LA Biplane Vol: 74.9 ml 35.23 ml/m  AORTIC VALVE LVOT Vmax:   119.00 cm/s LVOT Vmean:  79.200 cm/s LVOT VTI:    0.236 m  AORTA Ao Root diam: 3.10 cm Ao Asc diam:  2.80 cm MITRAL VALVE MV Area (PHT): 2.62 cm     SHUNTS MV Decel Time: 289 msec     Systemic VTI:  0.24 m MV E velocity: 72.70 cm/s   Systemic Diam: 1.90 cm MV A velocity: 101.00 cm/s MV E/A ratio:  0.72 Gwyndolyn Kaufman MD Electronically signed by Gwyndolyn Kaufman MD Signature Date/Time: 12/01/2019/3:52:44 PM    Final    CT HEAD CODE STROKE WO CONTRAST  Result Date: 11/30/2019 CLINICAL DATA:  Code stroke.  Acute neuro deficit.  Left-sided gaze. EXAM: CT HEAD WITHOUT CONTRAST TECHNIQUE: Contiguous axial images were obtained from the base of the skull through the vertex without intravenous contrast. COMPARISON:  None. FINDINGS: Brain: No evidence of acute infarction, hemorrhage, hydrocephalus, extra-axial collection or mass lesion/mass effect. Motion degraded study. Vascular: Negative for hyperdense vessel. Skull:  Negative Sinuses/Orbits: Paranasal sinuses clear.  Negative orbit. Other: None ASPECTS ([place] Stroke Program Early CT Score) - Ganglionic level infarction (caudate, lentiform nuclei, internal capsule, insula, M1-M3 cortex): 7 - Supraganglionic infarction (M4-M6 cortex): 3 Total score (0-10 with 10 being normal): 10 IMPRESSION: 1. Negative CT head 2. ASPECTS is 10 3. Code stroke imaging results were communicated on 11/30/2019 at 12:58 pm to provider [facility] via secure text paging. Electronically Signed   By: Franchot Gallo M.D.   On: 11/30/2019 12:59   CT ANGIO HEAD CODE STROKE  Result Date: 11/30/2019 CLINICAL DATA:  64 year old female with right side hemiparesis and leftward gaze. Had a seizure in the CT suite. Last known well 0130 hours. Negative plain head CT at 1257 hours today. EXAM: CT ANGIOGRAPHY HEAD AND NECK CT PERFUSION BRAIN TECHNIQUE: Multidetector  CT imaging of the head and neck was performed using the standard protocol during bolus administration of intravenous contrast. Multiplanar CT image reconstructions and MIPs were obtained to evaluate the vascular anatomy. Carotid stenosis measurements (when applicable) are obtained utilizing NASCET criteria, using the distal internal carotid diameter as the denominator. Multiphase CT imaging of the brain was performed following IV bolus contrast injection. Subsequent parametric perfusion maps were calculated using RAPID software. CONTRAST:  154m OMNIPAQUE IOHEXOL 350 MG/ML SOLN COMPARISON:  Head CT 1257 hours today. FINDINGS: CT Brain Perfusion Findings: ASPECTS: 10 CBF (<30%) Volume: 065mPerfusion (Tmax>6.0s) volume: 31m58mismatch Volume: Not applicable Infarction Location:No infarct core detected. No CBF or CBV abnormality. There is 79 mL of T-max greater than 4 seconds which is mostly but not completely in the posterior left MCA territory. CTA NECK Skeleton: Absent maxillary dentition. No acute osseous abnormality identified. Upper chest: Negative. Other neck: Mild thyroid enlargement. No discrete thyroid nodule. Upper limits of normal to mildly enlarged bilateral level 1B/2A lymph nodes, up to 9 mm short axis and slightly greater on the left. No cystic or necrotic nodes are evident. Other neck soft tissue contours are within normal limits. Aortic arch: Aberrant origin of the right subclavian artery, with shared origin of the CCA is. Three vessel arch configuration. Mild arch atherosclerosis. Right carotid system: Mildly tortuous proximal right CCA. No stenosis proximal to the bifurcation. Minimal plaque at the bifurcation. No cervical right ICA stenosis. Left carotid system: Mildly tortuous proximal left CCA with no plaque or stenosis. Mild soft and calcified plaque at the lateral left ICA origin without stenosis to the skull base. 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 Normal right vertebral artery origin. Mildly tortuous right V1 segment. Mildly non dominant right vertebral artery. Tortuosity at the C3 level. Patent right vertebral to the skull base without plaque or stenosis. Minimal plaque in the proximal left subclavian artery without stenosis. Normal left vertebral artery origin. Mildly dominant and tortuous left vertebral artery is patent to the skull base without plaque or stenosis. CTA HEAD Suboptimal intracranial IV contrast bolus. Posterior circulation: Normal V4 segments, the left is mildly dominant. Normal left PICA origin. The right AICA appears dominant and patent. Patent vertebrobasilar junction and basilar artery without stenosis. Patent SCA and PCA origins. Posterior communicating arteries are diminutive or absent. Both PCA P1 and P2 segments are patent. Both PCA bifurcations appear patent but there is asymmetrically decreased distal left PCA flow on the left (series 10, image 23). No discrete PCA branch enhancement is evident. Anterior circulation: Both ICA siphons are patent. On the right there is moderate siphon calcified  plaque and moderate to severe stenosis just distal to the anterior genu (series 6, image 90). The right ICA terminus remains patent. On the left heavily calcified distal cavernous and supraclinoid ICA also, with moderate to severe supraclinoid stenosis as well (series 5, image 88). Patent left carotid terminus.  Patent MCA and ACA origins. Diminutive and irregular ACA A1 appearance a especially on the right where moderate to severe stenosis is noted on series 9, image 21. But no ACA branch occlusion is identified. 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 No right M2 occlusion is identified. On the left side the M1 is patent to the bifurcation and tortuous (series 8, image 18). At the bifurcation there are  asymmetrically fewer enhancing left MCA branches suggesting 1 or more M2 occlusions (see series 10, image 27 versus series 10, image 13 on the right). And M2 occlusion is suspected on the source images series 7, image 117. But other MCA branch detail is limited. Venous sinuses: Not evaluated due to early/suboptimal contrast. Anatomic variants: Aberrant origin right subclavian artery. Shared CCA origin. Review of the MIP images confirms the above findings IMPRESSION: 1. 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 2. Furthermore, the intracranial contrast bolus is suboptimal and there is intracranial atherosclerosis, including up to Severe bilateral Supraclinoid ICA stenoses. 3. There is also asymmetric decreased enhancement of the distal left PCA branches. 4. 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 5. But minimal atherosclerosis and no arterial stenosis in the neck. 6. Mild nonspecific level 2 cervical lymphadenopathy. [facility] and CTP findings reviewed in person with Dr. Theda Sers and [place] on 11/30/2019 at 1323 hours. And in discussion that included NIR, decision was made for follow-up stat Brain MRI (to include at least DWI and FLAIR) and intracranial MRA to augment the decision making in this case. Electronically Signed   By: Genevie Ann M.D.   On: 11/30/2019 13:40   CT ANGIO NECK CODE STROKE  Result Date: 11/30/2019 CLINICAL DATA:  64 year old female with right side hemiparesis and leftward gaze. Had a seizure in the CT suite. Last known well 0130 hours. Negative plain head CT at 1257 hours today. EXAM: CT ANGIOGRAPHY HEAD AND NECK CT PERFUSION BRAIN TECHNIQUE: Multidetector CT imaging of the head and neck was performed using the standard protocol during bolus administration of intravenous contrast. Multiplanar CT image reconstructions and MIPs were obtained to evaluate the vascular anatomy.  Carotid stenosis measurements (when applicable) are obtained utilizing NASCET criteria, using the distal internal carotid diameter as the denominator. Multiphase CT imaging of the brain was performed following IV bolus contrast injection. Subsequent parametric perfusion maps were calculated using RAPID software. CONTRAST:  154m OMNIPAQUE IOHEXOL 350 MG/ML SOLN COMPARISON:  Head CT 1257 hours today. FINDINGS: CT Brain Perfusion Findings: ASPECTS: 10 CBF (<30%) Volume: 041mPerfusion (Tmax>6.0s) volume: 30m71mismatch Volume: Not applicable Infarction Location:No infarct core detected. No CBF or CBV abnormality. There is 79 mL of T-max greater than 4 seconds which is mostly but not completely in the posterior left MCA territory. CTA NECK Skeleton: Absent maxillary dentition. No acute osseous abnormality identified. Upper chest: Negative. Other neck: Mild thyroid enlargement. No discrete thyroid nodule. Upper limits of normal to mildly enlarged bilateral level 1B/2A lymph nodes, up to 9 mm short axis and  slightly greater on the left. No cystic or necrotic nodes are evident. Other neck soft tissue contours are within normal limits. Aortic arch: Aberrant origin of the right subclavian artery, with shared origin of the CCA is. Three vessel arch configuration. Mild arch atherosclerosis. Right carotid system: Mildly tortuous proximal right CCA. No stenosis proximal to the bifurcation. Minimal plaque at the bifurcation. No cervical right ICA stenosis. Left carotid system: Mildly tortuous proximal left CCA with no plaque or stenosis. Mild soft and calcified plaque at the lateral left ICA origin without stenosis to the skull base. 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 Normal right vertebral artery origin. Mildly tortuous right V1 segment. Mildly non dominant right vertebral artery. Tortuosity at the C3 level. Patent right vertebral to the skull base  without plaque or stenosis. Minimal plaque in the proximal left subclavian artery without stenosis. Normal left vertebral artery origin. Mildly dominant and tortuous left vertebral artery is patent to the skull base without plaque or stenosis. CTA HEAD Suboptimal intracranial IV contrast bolus. Posterior circulation: Normal V4 segments, the left is mildly dominant. Normal left PICA origin. The right AICA appears dominant and patent. Patent vertebrobasilar junction and basilar artery without stenosis. Patent SCA and PCA origins. Posterior communicating arteries are diminutive or absent. Both PCA P1 and P2 segments are patent. Both PCA bifurcations appear patent but there is asymmetrically decreased distal left PCA flow on the left (series 10, image 23). No discrete PCA branch enhancement is evident. Anterior circulation: Both ICA siphons are patent. On the right there is moderate siphon calcified plaque and moderate to severe stenosis just distal to the anterior genu (series 6, image 90). The right ICA terminus remains patent. On the left heavily calcified distal cavernous and supraclinoid ICA also, with moderate to severe supraclinoid stenosis as well (series 5, image 88). Patent left carotid terminus.  Patent MCA and ACA origins. Diminutive and irregular ACA A1 appearance a especially on the right where moderate to severe stenosis is noted on series 9, image 21. But no ACA branch occlusion is identified. 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 No right M2 occlusion is identified. On the left side the M1 is patent to the bifurcation and tortuous (series 8, image 18). At the bifurcation there are asymmetrically fewer enhancing left MCA branches suggesting 1 or more M2 occlusions (see series 10, image 27 versus series 10, image 13 on the right). And M2 occlusion is suspected on the source images series 7, image  117. But other MCA branch detail is limited. Venous sinuses: Not evaluated due to early/suboptimal contrast. Anatomic variants: Aberrant origin right subclavian artery. Shared CCA origin. Review of the MIP images confirms the above findings IMPRESSION: 1. 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 2. Furthermore, the intracranial contrast bolus is suboptimal and there is intracranial atherosclerosis, including up to Severe bilateral Supraclinoid ICA stenoses. 3. There is also asymmetric decreased enhancement of the distal left PCA branches. 4. 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 5. But minimal atherosclerosis and no arterial stenosis in the neck. 6. Mild nonspecific level 2 cervical lymphadenopathy. [facility] and CTP findings reviewed in person with Dr. Theda Sers and [place] on 11/30/2019 at 1323 hours. And in discussion that  included NIR, decision was made for follow-up stat Brain MRI (to include at least DWI and FLAIR) and intracranial MRA to augment the decision making in this case. Electronically Signed   By: Genevie Ann M.D.   On: 11/30/2019 13:40   DG Lumbar Puncture Fluoro Guide  Result Date: 11/30/2019 CLINICAL DATA:  64 year old female code stroke presentation, new onset seizure, unexplained altered mental status. Found to be febrile unsuccessful bedside lumbar puncture in the emergency department. EXAM: DIAGNOSTIC LUMBAR PUNCTURE UNDER FLUOROSCOPIC GUIDANCE FLUOROSCOPY TIME:  Fluoroscopy Time:  0 minutes 18 seconds Radiation Exposure Index (if provided by the fluoroscopic device): Number of Acquired Spot Images: 0 PROCEDURE: Informed consent was obtained from the patient's family prior to the procedure, including potential complications of headache, allergy, and pain. With the patient prone, the lower back was prepped with Betadine. 1% Lidocaine was used for local anesthesia. Lumbar  puncture was performed at the L2-L3 level using right sub laminar technique with a 5 inch, 20 gauge needle with return of clear, colorless CSF. 17.5 mL of CSF were obtained for laboratory studies. The patient tolerated the procedure well and there were no apparent complications. Appropriate post procedural orders were placed on the chart. The patient was returned to the ED in stable condition for continued treatment. IMPRESSION: Fluoroscopic guided lumbar puncture at L2-L3. 17.5 mL of clear, colorless CSF obtained and sent to the lab for analysis. Electronically Signed   By: Genevie Ann M.D.   On: 11/30/2019 17:25       Subjective: Patient has no confusion, no fever, no seizures, no headache, no photophobia, no focal weakness or numbness.  Discharge Exam: Vitals:   12/03/19 0305 12/03/19 0700  BP: 128/87 120/83  Pulse: 74 95  Resp: 20 (!) 9  Temp: 98.6 F (37 C) 98.1 F (36.7 C)  SpO2: 94% 97%   Vitals:   12/02/19 1951 12/02/19 2349 12/03/19 0305 12/03/19 0700  BP: 123/74 125/71 128/87 120/83  Pulse: 76 77 74 95  Resp: 20 20 20  (!) 9  Temp: 97.8 F (36.6 C) 98.3 F (36.8 C) 98.6 F (37 C) 98.1 F (36.7 C)  TempSrc: Axillary Oral Oral Axillary  SpO2: 93% 97% 94% 97%  Weight:      Height:        General: Pt is alert, awake, not in acute distress Cardiovascular: RRR, nl S1-S2, no murmurs appreciated.   No LE edema.   Respiratory: Normal respiratory rate and rhythm.  CTAB without rales or wheezes. Abdominal: Abdomen soft and non-tender.  No distension or HSM.   Neuro/Psych: Strength symmetric in upper and lower extremities although she appears generally weak.  Judgment and insight appear normal.   The results of significant diagnostics from this hospitalization (including imaging, microbiology, ancillary and laboratory) are listed below for reference.     Microbiology: Recent Results (from the past 240 hour(s))  Respiratory Panel by RT PCR (Flu A&B, Covid) - Nasopharyngeal  Swab     Status: None   Collection Time: 11/30/19  1:25 PM   Specimen: Nasopharyngeal Swab  Result Value Ref Range Status   SARS Coronavirus 2 by RT PCR NEGATIVE NEGATIVE Final    Comment: (NOTE) SARS-CoV-2 target nucleic acids are NOT DETECTED.  The SARS-CoV-2 RNA is generally detectable in upper respiratoy specimens during the acute phase of infection. The lowest concentration of SARS-CoV-2 viral copies this assay can detect is 131 copies/mL. A negative result does not preclude SARS-Cov-2 infection and should not be used as the  sole basis for treatment or other patient management decisions. A negative result may occur with  improper specimen collection/handling, submission of specimen other than nasopharyngeal swab, presence of viral mutation(s) within the areas targeted by this assay, and inadequate number of viral copies (<131 copies/mL). A negative result must be combined with clinical observations, patient history, and epidemiological information. The expected result is Negative.  Fact Sheet for Patients:  PinkCheek.be  Fact Sheet for Healthcare Providers:  GravelBags.it  This test is no t yet approved or cleared by the [place] FDA and  has been authorized for detection and/or diagnosis of SARS-CoV-2 by FDA under an Emergency Use Authorization (EUA). This EUA will remain  in effect (meaning this test can be used) for the duration of the COVID-19 declaration under Section 564(b)(1) of the Act, 21 U.S.C. section 360bbb-3(b)(1), unless the authorization is terminated or revoked sooner.     Influenza A by PCR NEGATIVE NEGATIVE Final   Influenza B by PCR NEGATIVE NEGATIVE Final    Comment: (NOTE) The Xpert Xpress SARS-CoV-2/FLU/RSV assay is intended as an aid in  the diagnosis of influenza from Nasopharyngeal swab specimens and  should not be used as a sole basis for treatment. Nasal washings and  aspirates are  unacceptable for Xpert Xpress SARS-CoV-2/FLU/RSV  testing.  Fact Sheet for Patients: PinkCheek.be  Fact Sheet for Healthcare Providers: GravelBags.it  This test is not yet approved or cleared by the [place] FDA and  has been authorized for detection and/or diagnosis of SARS-CoV-2 by  FDA under an Emergency Use Authorization (EUA). This EUA will remain  in effect (meaning this test can be used) for the duration of the  Covid-19 declaration under Section 564(b)(1) of the Act, 21  U.S.C. section 360bbb-3(b)(1), unless the authorization is  terminated or revoked. Performed at [facility], [place] [address] [place], [place] [address]   Culture, blood (routine x 2)     Status: None (Preliminary result)   Collection Time: 11/30/19  3:06 PM   Specimen: BLOOD RIGHT WRIST  Result Value Ref Range Status   Specimen Description BLOOD RIGHT WRIST  Final   Special Requests   Final    BOTTLES DRAWN AEROBIC AND ANAEROBIC Blood Culture results may not be optimal due to an inadequate volume of blood received in culture bottles   Culture   Final    NO GROWTH 3 DAYS Performed at [facility], [place] [address], [place], [place] [address]    Report Status PENDING  Incomplete  CSF culture     Status: None (Preliminary result)   Collection Time: 11/30/19  5:10 PM   Specimen: PATH Cytology CSF; Cerebrospinal Fluid  Result Value Ref Range Status   Specimen Description CSF  Final   Special Requests NONE  Final   Gram Stain   Final    WBC PRESENT,BOTH PMN AND MONONUCLEAR NO ORGANISMS SEEN CYTOSPIN SMEAR    Culture   Final    NO GROWTH 3 DAYS Performed at [facility], [place] [address], [place], [place] [address]    Report Status PENDING  Incomplete  Culture, fungus without smear     Status: None (Preliminary result)   Collection Time: 11/30/19  5:10 PM   Specimen: PATH Cytology CSF; Cerebrospinal Fluid  Result Value Ref  Range Status   Specimen Description CSF  Final   Special Requests NONE  Final   Culture   Final    NO FUNGUS ISOLATED AFTER 2 DAYS  Performed at [facility], [place] [address], [place], [place] [address]    Report Status PENDING  Incomplete  MRSA PCR Screening     Status: Abnormal   Collection Time: 12/01/19  7:00 AM   Specimen: Nasal Mucosa; Nasopharyngeal  Result Value Ref Range Status   MRSA by PCR POSITIVE (A) NEGATIVE Final    Comment:        The GeneXpert MRSA Assay (FDA approved for NASAL specimens only), is one component of a comprehensive MRSA colonization surveillance program. It is not intended to diagnose MRSA infection nor to guide or monitor treatment for MRSA infections. RESULT CALLED TO, READ BACK BY AND VERIFIED WITH: Elmyra Ricks RN 10:40 12/01/19 ([email or web address]) Performed at [facility], [place] [address] [place], [place] [address]      Labs: BNP (last 3 results) No results for input(s): BNP in the last 8760 hours. Basic Metabolic Panel: Recent Labs  Lab 11/30/19 1248 11/30/19 1251 12/01/19 1832 12/01/19 2043  NA 139 140 146*  --   K 3.7 3.6 3.3*  --   CL 104 108 108  --   CO2 18*  --  24  --   GLUCOSE 337* 340* 130*  --   BUN 12 12 19   --   CREATININE 1.01* 0.60 0.79  --   CALCIUM 9.9  --  9.9  --   MG  --   --   --  2.1  PHOS  --   --   --  3.7   Liver Function Tests: Recent Labs  Lab 11/30/19 1248 12/01/19 1832  AST 18 22  ALT 19 18  ALKPHOS 87 92  BILITOT 1.2 1.3*  PROT 7.5 7.2  ALBUMIN 3.7 3.3*   No results for input(s): LIPASE, AMYLASE in the last 168 hours. No results for input(s): AMMONIA in the last 168 hours. CBC: Recent Labs  Lab 11/30/19 1248 11/30/19 1251 12/01/19 1832  WBC 16.6*  --  18.2*  NEUTROABS 12.8*  --   --   HGB 16.4* 17.0* 16.4*  HCT 49.5* 50.0* 49.3*  MCV 88.4  --  90.6  PLT 328  --  297   Cardiac Enzymes: No results for input(s): CKTOTAL, CKMB, CKMBINDEX, TROPONINI in the last 168  hours. BNP: Invalid input(s): POCBNP CBG: Recent Labs  Lab 12/02/19 1657 12/02/19 1956 12/02/19 2352 12/03/19 0310 12/03/19 0752  GLUCAP 263* 303* 295* 266* 235*   D-Dimer No results for input(s): DDIMER in the last 72 hours. Hgb A1c Recent Labs    12/01/19 0134 12/01/19 1832  HGBA1C >15.5* >15.5*   Lipid Profile Recent Labs    12/01/19 0134  CHOL 180  HDL 31*  LDLCALC 125*  TRIG 122  CHOLHDL 5.8   Thyroid function studies Recent Labs    12/01/19 0134  TSH 0.249*   Anemia work up No results for input(s): VITAMINB12, FOLATE, FERRITIN, TIBC, IRON, RETICCTPCT in the last 72 hours. Urinalysis No results found for: COLORURINE, APPEARANCEUR, [place], [place], [place], [place], [place], [place], PROTEINUR, UROBILINOGEN, NITRITE, LEUKOCYTESUR Sepsis Labs Invalid input(s): PROCALCITONIN,  WBC,  LACTICIDVEN Microbiology Recent Results (from the past 240 hour(s))  Respiratory Panel by RT PCR (Flu A&B, Covid) - Nasopharyngeal Swab     Status: None   Collection Time: 11/30/19  1:25 PM   Specimen: Nasopharyngeal Swab  Result Value Ref Range Status   SARS Coronavirus 2 by RT PCR NEGATIVE NEGATIVE Final    Comment: (NOTE) SARS-CoV-2 target nucleic acids are NOT DETECTED.  The SARS-CoV-2 RNA is generally detectable in upper respiratoy specimens during the acute phase of infection. The lowest concentration of SARS-CoV-2 viral copies this assay can detect is 131 copies/mL. A negative result does not preclude SARS-Cov-2 infection and should not be used as the sole basis for treatment or other patient management decisions. A negative result may occur with  improper specimen collection/handling, submission of specimen other than nasopharyngeal swab, presence of viral mutation(s) within the areas targeted by this assay, and inadequate number of viral copies (<131 copies/mL). A negative result must be combined with clinical observations, patient history, and  epidemiological information. The expected result is Negative.  Fact Sheet for Patients:  PinkCheek.be  Fact Sheet for Healthcare Providers:  GravelBags.it  This test is no t yet approved or cleared by the [place] FDA and  has been authorized for detection and/or diagnosis of SARS-CoV-2 by FDA under an Emergency Use Authorization (EUA). This EUA will remain  in effect (meaning this test can be used) for the duration of the COVID-19 declaration under Section 564(b)(1) of the Act, 21 U.S.C. section 360bbb-3(b)(1), unless the authorization is terminated or revoked sooner.     Influenza A by PCR NEGATIVE NEGATIVE Final   Influenza B by PCR NEGATIVE NEGATIVE Final    Comment: (NOTE) The Xpert Xpress SARS-CoV-2/FLU/RSV assay is intended as an aid in  the diagnosis of influenza from Nasopharyngeal swab specimens and  should not be used as a sole basis for treatment. Nasal washings and  aspirates are unacceptable for Xpert Xpress SARS-CoV-2/FLU/RSV  testing.  Fact Sheet for Patients: PinkCheek.be  Fact Sheet for Healthcare Providers: GravelBags.it  This test is not yet approved or cleared by the [place] FDA and  has been authorized for detection and/or diagnosis of SARS-CoV-2 by  FDA under an Emergency Use Authorization (EUA). This EUA will remain  in effect (meaning this test can be used) for the duration of the  Covid-19 declaration under Section 564(b)(1) of the Act, 21  U.S.C. section 360bbb-3(b)(1), unless the authorization is  terminated or revoked. Performed at [facility], [place] [address], [place], [place] [address]   Culture, blood (routine x 2)     Status: None (Preliminary result)   Collection Time: 11/30/19  3:06 PM   Specimen: BLOOD RIGHT WRIST  Result Value Ref Range Status   Specimen Description BLOOD RIGHT WRIST  Final   Special  Requests   Final    BOTTLES DRAWN AEROBIC AND ANAEROBIC Blood Culture results may not be optimal due to an inadequate volume of blood received in culture bottles   Culture   Final    NO GROWTH 3 DAYS Performed at [facility], [place] [address] [place], [place] [address]    Report Status PENDING  Incomplete  CSF culture     Status: None (Preliminary result)   Collection Time: 11/30/19  5:10 PM   Specimen: PATH Cytology CSF; Cerebrospinal Fluid  Result Value Ref Range Status   Specimen Description CSF  Final   Special Requests NONE  Final   Gram Stain   Final    WBC PRESENT,BOTH PMN AND MONONUCLEAR NO ORGANISMS SEEN CYTOSPIN SMEAR    Culture   Final    NO GROWTH 3 DAYS Performed at [facility], [place] [address], [place],  [address]    Report Status PENDING  Incomplete  Culture, fungus without smear     Status: None (Preliminary result)   Collection Time: 11/30/19  5:10 PM   Specimen: PATH Cytology CSF; Cerebrospinal Fluid  Result Value Ref Range Status   Specimen Description CSF  Final   Special Requests NONE  Final   Culture   Final    NO FUNGUS ISOLATED AFTER 2 DAYS Performed at [facility], [address] [place], [place] [address]    Report Status PENDING  Incomplete  MRSA PCR Screening     Status: Abnormal   Collection Time: 12/01/19  7:00 AM   Specimen: Nasal Mucosa; Nasopharyngeal  Result Value Ref Range Status   MRSA by PCR POSITIVE (A) NEGATIVE Final    Comment:        The GeneXpert MRSA Assay (FDA approved for NASAL specimens only), is one component of a comprehensive MRSA colonization surveillance program. It is not intended to diagnose MRSA infection nor to guide or monitor treatment for MRSA infections. RESULT CALLED TO, READ BACK BY AND VERIFIED WITH: Elmyra Ricks RN 10:40 12/01/19 ([email or web address]) Performed at [facility], [place] [address] [place], [place] [address]      Time coordinating discharge: 35  minutes      SIGNED:   Edwin Dada, MD  [facility] 12/03/2019, 10:12 AM

## 2019-12-03 NOTE — Progress Notes (Signed)
Physical Therapy Treatment Patient Details Name: Julia Medina MRN: [id number] DOB: 1955/10/15 Today's Date: 12/03/2019    History of Present Illness 64 y.o. female with medical history significant of COPD; CAD with stents; and HTN presenting with R-sided weakness and seizure. Pt underwent LP on 11/30/2019. 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    PT Comments    Pt much improved this AM. Supervision bed mobility, min guard assist transfers with RW, and min guard assist ambulation 150' with RW. Pt easily distracted requiring cues to stay on task. Cues needed during amb for posture and safe RW management. Pt sitting EOB at end of session.    Follow Up Recommendations  Home health PT;Supervision for mobility/OOB     Equipment Recommendations  None recommended by PT    Recommendations for Other Services       Precautions / Restrictions Precautions Precautions: Fall;Other (comment) Precaution Comments: seizure    Mobility  Bed Mobility Overal bed mobility: Needs Assistance Bed Mobility: Supine to Sit     Supine to sit: Supervision;HOB elevated     General bed mobility comments: +rail, increased time  Transfers Overall transfer level: Needs assistance Equipment used: Rolling walker (2 wheeled) Transfers: Sit to/from [employer] Sit to Stand: Min guard Stand pivot transfers: Min guard       General transfer comment: cues for hand placement, increased time  Ambulation/Gait Ambulation/Gait assistance: Min guard Gait Distance (Feet): 150 Feet Assistive device: Rolling walker (2 wheeled) Gait Pattern/deviations: Step-through pattern;Decreased stride length;Trunk flexed Gait velocity: decreased Gait velocity interpretation: <1.31 ft/sec, indicative of household ambulator General Gait Details: cues for posture. Pt tends to push RW too far out in front.   Stairs Stairs:  (simulated ascend 1 step with RW in room)            Wheelchair Mobility    Modified Rankin (Stroke Patients Only)       Balance Overall balance assessment: Needs assistance Sitting-balance support: No upper extremity supported;Feet supported Sitting balance-Leahy Scale: Good     Standing balance support: Bilateral upper extremity supported;During functional activity Standing balance-Leahy Scale: Poor Standing balance comment: reliant on UE support and minG                            Cognition Arousal/Alertness: Awake/alert Behavior During Therapy: WFL for tasks assessed/performed Overall Cognitive Status: Impaired/Different from baseline Area of Impairment: Orientation;Memory;Safety/judgement;Problem solving;Awareness                 Orientation Level: Time   Memory: Decreased short-term memory   Safety/Judgement: Decreased awareness of safety;Decreased awareness of deficits Awareness: Emergent Problem Solving: Slow processing;Difficulty sequencing;Requires verbal cues General Comments: Oriented to year but not month. Pt reports "I felt a little confused when I woke up this morning. I still don't remember what happened."      Exercises      General Comments General comments (skin integrity, edema, etc.): VSS on RA      Pertinent Vitals/Pain Pain Assessment: No/denies pain    Home Living                      Prior Function            PT Goals (current goals can now be found in the care plan section) Acute Rehab PT Goals Patient Stated Goal: home Progress towards PT goals: Progressing toward goals    Frequency    Min 3X/week  PT Plan Equipment recommendations need to be updated    Co-evaluation              AM-PAC PT "6 Clicks" Mobility   Outcome Measure  Help needed turning from your back to your side while in a flat bed without using bedrails?: None Help needed moving from lying on your back to sitting on the side of a flat bed without using bedrails?: A  Little Help needed moving to and from a bed to a chair (including a wheelchair)?: A Little Help needed standing up from a chair using your arms (e.g., wheelchair or bedside chair)?: A Little Help needed to walk in hospital room?: A Little Help needed climbing 3-5 steps with a railing? : A Lot 6 Click Score: 18    End of Session Equipment Utilized During Treatment: Gait belt Activity Tolerance: Patient tolerated treatment well Patient left: in bed;with call bell/phone within reach Nurse Communication: Mobility status PT Visit Diagnosis: Unsteadiness on feet (R26.81);Other abnormalities of gait and mobility (R26.89);Muscle weakness (generalized) (M62.81);Other symptoms and signs involving the nervous system (G66.599)     Time: 3570-1779 PT Time Calculation (min) (ACUTE ONLY): 25 min  Charges:  $Gait Training: 23-37 mins                     Aida Raider, Bartolo  Office # [phone] Pager [phone]    Ilda Foil 12/03/2019, 9:09 AM

## 2019-12-03 NOTE — Progress Notes (Addendum)
Inpatient Diabetes Program Recommendations  AACE/ADA: New Consensus Statement on Inpatient Glycemic Control   Target Ranges:  Prepandial:   less than 140 mg/dL      Peak postprandial:   less than 180 mg/dL (1-2 hours)      Critically ill patients:  140 - 180 mg/dL  Results for YARETZY, OLAZABAL (MRN [id number]) as of 12/03/2019 10:26  Ref. Range 12/02/2019 07:41 12/02/2019 11:09 12/02/2019 16:57 12/02/2019 19:56 12/02/2019 23:52 12/03/2019 03:10 12/03/2019 07:52  Glucose-Capillary Latest Ref Range: 70 - 99 mg/dL 321 (H) 295 (H) 263 (H) 303 (H) 295 (H) 266 (H) 235 (H)   Results for LANDREE, FERNHOLZ (MRN [id number]) as of 12/03/2019 10:26  Ref. Range 12/01/2019 01:34 12/01/2019 18:32  Hemoglobin A1C Latest Ref Range: 4.8 - 5.6 % >15.5 (H) >15.5 (H)   Review of Glycemic Control  Diabetes history: NO Outpatient Diabetes medications: NA Current orders for Inpatient glycemic control: Lantus 14 units daily at bedtime, Metformin 500 daily, Novolog 0-15 units Q4H  Inpatient Diabetes Program Recommendations:    Insulin: Patient will need to be discharged on affordable insulin. Please prescribe Novolin N for discharge as patient can get at [facility] for $25 per vial. Please consider discharging on Novolin N 12 units BID.  NOTE: RN paged regarding patient with new DM dx and being discharged today. Diabetes Coordinator is not on campus over the weekend. Called patient's cell number and spoke with patient and her husband over the phone. Patient reports that she has not seen a provider in over a year. She states she was going to [facility] and she plans to follow up there. Patient's husband notes that patient does have a Doctor, general practice plan that he pays out of pocket for but he notes that she does not have any medication coverage.   Discussed A1C results (>15.5% on 12/01/19) and explained what an A1C is and informed patient that current A1C indicates an average glucose over 398 mg/dl over  the past 2-3 months. Discussed basic pathophysiology of DM Type 2, basic home care, importance of checking CBGs and maintaining good CBG control to prevent long-term and short-term complications. Reviewed glucose and A1C goals.  Reviewed signs and symptoms of hyperglycemia and hypoglycemia along with treatment for both. Discussed impact of nutrition, exercise, stress, sickness, and medications on diabetes control. Discussed carb modified diet.  Informed patient that a Living Well with diabetes booklet and and insulin starter kit would be ordered. Encouraged patient to read through entire book and insulin starter kit.  Per current discharge in chart, patient is to discharge on Lantus and Metformin. Since patient does not have any medication coverage, will ask discharging MD to discharge on affordable insulin (Novolin N in place of Lantus as Lantus is over $250 per vial and Novolin N is $25 per vial at [facility]).   Informed patient that Novolin N can be purchased at [facility] for $25 per vial. Patient's husband stated that they are both retired and have a limited income but they will try to work it out to afford the $25 for the insulin.  Encouraged patient to go to [facility] to get the Reli-On Prime glucometer for $9 and a box of 50 Reli-On test strips for $9. Discussed NPH insulin in detail (how to take it, when to take it). Asked patient to check  glucose 4 times per day (before meals and at bedtime) and to keep a log book of glucose readings and insulin taken. Explained how the doctor  she follows up with can use the log book to continue to make insulin adjustments if needed. Informed patient that RN will be asked to educate on insulin and will be asking her to self-administer insulin to ensure proper technique and ability to administer self insulin shots.  Asked patient to call [place] center on Monday morning to get appointment for early next week for follow up. Encouraged patient to reach out to PCP if  glucose is consistently over 200 mg/dl or if she has any issues with hypoglycemia.  Informed patient that TOC was consulted to see if they can assist with medications since she has no medication coverage. Patient verbalized understanding of information discussed and she states that she has no further questions at this time related to diabetes.   RNs to provide ongoing basic DM education at bedside with this patient and engage patient to actively check blood glucose and administer insulin injections. At time of discharge, please provide Rx for: Novolin N (#[id number]), insulin syringes (#[id number]), and glucose monitoring kit (#[id number]).  Thanks, Barnie Alderman, RN, MSN, CDE Diabetes Coordinator Inpatient Diabetes Program [phone] (Team Pager from 8am to 5pm)

## 2019-12-04 LAB — CSF CULTURE W GRAM STAIN: Culture: NO GROWTH

## 2019-12-05 LAB — CULTURE, BLOOD (ROUTINE X 2): Culture: NO GROWTH

## 2019-12-07 LAB — HSV TYPE 2 AB IGG, CSF (REFLEXED): HSV Type 2 Ab IgG, CSF (Reflexed): 0.31 IV (ref <=0.89)

## 2019-12-07 LAB — HSV TYPE 1 AB, IGG, CSF (REFLEXED): HSV Type 1 Ab, IgG, CSF (Reflexed): 0.07 IV (ref <=0.89)

## 2019-12-08 LAB — HSV 1/2 AB IGG/IGM CSF
HSV 1/2 Ab Screen IgG, CSF: 1.42 IV — ABNORMAL HIGH (ref <=0.89)
HSV 1/2 Ab, IgM, CSF: 0.13 IV (ref <=0.89)

## 2019-12-21 LAB — CULTURE, FUNGUS WITHOUT SMEAR

## 2019-12-23 LAB — ARBOVIRUS PANEL, ~~LOC~~ LAB

## 2020-02-21 ENCOUNTER — Ambulatory Visit (INDEPENDENT_AMBULATORY_CARE_PROVIDER_SITE_OTHER): Payer: PRIVATE HEALTH INSURANCE | Admitting: Neurology

## 2020-02-21 ENCOUNTER — Other Ambulatory Visit: Payer: Self-pay

## 2020-02-21 ENCOUNTER — Encounter: Payer: Self-pay | Admitting: Neurology

## 2020-02-21 VITALS — BP 119/72 | HR 73

## 2020-02-21 DIAGNOSIS — E1142 Type 2 diabetes mellitus with diabetic polyneuropathy: Secondary | ICD-10-CM

## 2020-02-21 DIAGNOSIS — G40209 Localization-related (focal) (partial) symptomatic epilepsy and epileptic syndromes with complex partial seizures, not intractable, without status epilepticus: Secondary | ICD-10-CM | POA: Diagnosis not present

## 2020-02-21 MED ORDER — CLONAZEPAM 0.5 MG PO TABS: 0.5000 mg | ORAL_TABLET | Freq: Two times a day (BID) | ORAL | 2 refills | Status: AC | PRN

## 2020-02-21 MED ORDER — GABAPENTIN 300 MG PO CAPS: 300.0000 mg | ORAL_CAPSULE | Freq: Three times a day (TID) | ORAL | 11 refills | Status: DC

## 2020-02-21 MED ORDER — LEVETIRACETAM 500 MG PO TABS
500.0000 mg | ORAL_TABLET | Freq: Two times a day (BID) | ORAL | 4 refills | Status: DC
Start: 2020-02-21 — End: 2021-02-11

## 2020-02-21 NOTE — Progress Notes (Signed)
Chief Complaint  Patient presents with   New Patient (Initial Visit)    She is here with her husband, Julia Medina. ED follow up for status epilepticus on 11/30/19. She has not had any further seizures since her discharge. Says she was provided with one month of levetiracetam 552m, one tablet twice daily but stopped taking it when she ran out of the prescription. She had never had seizure prior to this date. She was also newly diagnosed with diabetes while there. 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    HISTORICAL  Julia Medina a 64year old female, seen in request by primary care Julia OJenel Lucksfor evaluation of seizure, initial evaluation was with her husband on February 21, 2020  I reviewed and summarized the referring note.  Past medical history Coronary artery disease, COPD Diabetes  Hyperlipidemia Hypertension  She is a retired Julia Medina, nutritional did not know she had a history of diabetes, on November 30, 2019, during sleep, her husband noticed that she has intermittent rhythmic shaking of right arm, leg, that were stopping few minutes, she can be awakened in between, last all night long, for 8 or 10 hours, eventually was brought to the emergency room next morning,  Upon presentation, her glucose level was 337, patient was given Ativan, also had a fever, confusion, elevated blood pressure tachycardia heart rate of 112  She was treated with broad spectrum antibiotic, and acyclovir, lumbar puncture showed WBC of 24, with neutrophil 89%, monocyte 11%, RBC of 155, HSV PCR was negative, was seen by neuro hospitalist,  A1c was more than 15.5, she was started on insulin and Metformin, Keppra 500 mg twice daily her symptoms quickly improved,  It was thought due to combination of 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, with newly diagnosis diabetes, hyperglycemia,  I personally reviewed MRI of the brain without contrast in September 2021, no acute ischemic changes MRA  of the brain showed 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, intracranial atherosclerotic disease, with  severe left ICA stenosis at the cavernous segment  CT angiogram of the neck showed no significant large vessel disease  Laboratory evaluation showed negative HIV, West Nile virus, A1c was 15.5, normal free T4, procalcitonin, elevated WBC of 18.2, hemoglobin of 16.4, CMP showed creatinine of 0.79, lipid panel LDL of 125, cholesterol of 180,  Spinal fluid testing, negative HSV type I, type II IgG, IgM, HSV PCR, varicella-zoster PCR, VDRL, VDRL, TP 41,  She is now back home, complains of generalized weakness, diffuse body achy pain, tolerating Keppra 500 mg twice a day well, complains of bilateral feet numbness tingling burning pain,  REVIEW OF SYSTEMS: Full 14 system review of systems performed and notable only for as above All other review of systems were negative.  ALLERGIES: Allergies  Allergen Reactions   Aspirin Rash   Iodine Rash   Penicillins Rash   Sulfa Antibiotics Rash    HOME MEDICATIONS: Current Outpatient Medications  Medication Sig Dispense Refill   acetaminophen (TYLENOL) 650 MG CR tablet Take 650-1,300 mg by mouth every 8 (eight) hours as needed for pain.     albuterol (PROVENTIL HFA;VENTOLIN HFA) 108 (90 Base) MCG/ACT inhaler Inhale 2 puffs into the lungs every 4 (four) hours as needed for wheezing or shortness of breath. 1 Inhaler 0   blood glucose meter kit and supplies Dispense based on patient and insurance preference. Check blood sugar once daily. (FOR ICD-10 E10.9, E11.9). 1 each 0   clopidogrel (PLAVIX) 75 MG tablet Take 1  tablet (75 mg total) by mouth daily. 30 tablet 3   furosemide (LASIX) 20 MG tablet Take 1 tablet (20 mg total) by mouth daily. 30 tablet 3   hydrochlorothiazide (HYDRODIURIL) 25 MG tablet Take 1 tablet (25 mg total) by mouth daily. 30 tablet 3   insulin NPH Human (NOVOLIN N RELION) 100 UNIT/ML injection Inject 0.12 mLs (12  Units total) into the skin 2 (two) times daily before a meal. 10 mL 11   Insulin Syringes, Disposable, U-100 0.5 ML MISC Use to inject insulin twice daily 60 each 11   levETIRAcetam (KEPPRA) 500 MG tablet Take 1 tablet (500 mg total) by mouth 2 (two) times daily. 60 tablet 0   metFORMIN (GLUCOPHAGE) 500 MG tablet Take 1 tablet (500 mg total) by mouth daily with breakfast. 30 tablet 3   metoprolol tartrate (LOPRESSOR) 50 MG tablet Take 1 tablet (50 mg total) by mouth 2 (two) times daily. 60 tablet 3   omeprazole (PRILOSEC) 20 MG capsule Take 20 mg by mouth daily.     simvastatin (ZOCOR) 40 MG tablet Take 1 tablet (40 mg total) by mouth at bedtime. 30 tablet 3   No current facility-administered medications for this visit.    PAST MEDICAL HISTORY: Past Medical History:  Diagnosis Date   Aneurysm ([place])    CAD (coronary artery disease), native coronary artery    has stents   COPD (chronic obstructive pulmonary disease) (H[facility])    Diabetes ([place])    Diverticulosis    Hypertension    Seizure ([place])     PAST SURGICAL HISTORY: Past Surgical History:  Procedure Laterality Date   heart stents     intestine     diverticulosis   LUMBAR FUSION     TUMOR EXCISION     right ovary - benign    FAMILY HISTORY: Family History  Problem Relation Age of Onset   Other Mother        unsure of health history   Other Father        unsure of health history   Seizures Neg Hx     SOCIAL HISTORY: Social History   Socioeconomic History   Marital status: Married    Spouse name: Not on file   Number of children: 2   Years of education: two years of college   Highest education level: Not on file  Occupational History   Occupation: retired  Tobacco Use   Smoking status: Current Every Day Smoker    Packs/day: 1.00    Years: 45.00    Pack years: 45.00   Smokeless tobacco: Never Used  Substance and Sexual Activity   Alcohol use: Yes    Comment: rare   Drug use:  Never   Sexual activity: Not on file  Other Topics Concern   Not on file  Social History Narrative   Left-handed.   No daily use of caffeine.   Left-handed.   Social Determinants of Health   Financial Resource Strain: Not on file  Food Insecurity: Not on file  Transportation Needs: Not on file  Physical Activity: Not on file  Stress: Not on file  Social Connections: Not on file  Intimate Partner Violence: Not on file     PHYSICAL EXAM   Vitals:   02/21/20 1422  BP: 119/72  Pulse: 73   Not recorded     There is no height or weight on file to calculate BMI.  PHYSICAL EXAMNIATION:  Gen: NAD, conversant, well nourised, well groomed  Cardiovascular: Regular rate rhythm, no peripheral edema, warm, nontender. Eyes: Conjunctivae clear without exudates or hemorrhage Neck: Supple, no carotid bruits. Pulmonary: Clear to auscultation bilaterally   NEUROLOGICAL EXAM:  MENTAL STATUS: Speech:    Speech is normal; fluent and spontaneous with normal comprehension.  Cognition:     Orientation to time, place and person     Normal recent and remote memory     Normal Attention span and concentration     Normal Language, naming, repeating,spontaneous speech     Fund of knowledge   CRANIAL NERVES: CN II: Visual fields are full to confrontation. Pupils are round equal and briskly reactive to light. CN III, IV, VI: extraocular movement are normal. No ptosis. CN V: Facial sensation is intact to light touch CN VII: Face is symmetric with normal eye closure  CN VIII: Hearing is normal to causal conversation. CN IX, X: Phonation is normal. CN XI: Head turning and shoulder shrug are intact  MOTOR: There is no pronator drift of out-stretched arms. Muscle bulk and tone are normal. Muscle strength is normal.  REFLEXES: Reflexes are 2+ and symmetric at the biceps, triceps, knees, and trace ankles. Plantar responses are flexor.  SENSORY: Length dependent  decreased light touch, pinprick, vibratory sensation to ankle level   COORDINATION: There is no trunk or limb dysmetria noted.  GAIT/STANCE: She needs push-up to get up from seated position, antalgic,  DIAGNOSTIC DATA (LABS, IMAGING, TESTING) - I reviewed patient records, labs, notes, testing and imaging myself where available.   ASSESSMENT AND PLAN  Julia Medina is a 64 y.o. 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 Ph.D.  [facility] [address] [place], [place] [address] Ph: [phone] Fax: [phone]  CC:  Edwin Dada, MD [address] [place],  [place] [id number]

## 2020-02-22 ENCOUNTER — Telehealth: Payer: Self-pay | Admitting: Neurology

## 2020-02-22 NOTE — Telephone Encounter (Signed)
self pay order sent to GI. They will reach out to the patient to schedule.  °

## 2020-02-23 ENCOUNTER — Encounter: Payer: Self-pay | Admitting: Neurology

## 2020-02-23 LAB — VITAMIN B12: Vitamin B-12: 353 pg/mL (ref 232–1245)

## 2020-02-23 LAB — CBC WITH DIFFERENTIAL/PLATELET
Basophils Absolute: 0.1 10*3/uL (ref 0.0–0.2)
Basos: 1 %
EOS (ABSOLUTE): 0.4 10*3/uL (ref 0.0–0.4)
Eos: 3 %
Hematocrit: 45.8 % (ref 34.0–46.6)
Hemoglobin: 15.4 g/dL (ref 11.1–15.9)
Immature Grans (Abs): 0.1 10*3/uL (ref 0.0–0.1)
Immature Granulocytes: 1 %
Lymphocytes Absolute: 4.2 10*3/uL — ABNORMAL HIGH (ref 0.7–3.1)
Lymphs: 28 %
MCH: 29.8 pg (ref 26.6–33.0)
MCHC: 33.6 g/dL (ref 31.5–35.7)
MCV: 89 fL (ref 79–97)
Monocytes Absolute: 0.9 10*3/uL (ref 0.1–0.9)
Monocytes: 6 %
Neutrophils Absolute: 9.3 10*3/uL — ABNORMAL HIGH (ref 1.4–7.0)
Neutrophils: 61 %
Platelets: 376 10*3/uL (ref 150–450)
RBC: 5.17 x10E6/uL (ref 3.77–5.28)
RDW: 13.9 % (ref 11.7–15.4)
WBC: 14.9 10*3/uL — ABNORMAL HIGH (ref 3.4–10.8)

## 2020-02-23 LAB — COMPREHENSIVE METABOLIC PANEL
ALT: 9 IU/L (ref 0–32)
AST: 10 IU/L (ref 0–40)
Albumin/Globulin Ratio: 1.4 (ref 1.2–2.2)
Albumin: 4.5 g/dL (ref 3.8–4.8)
Alkaline Phosphatase: 134 IU/L — ABNORMAL HIGH (ref 44–121)
BUN/Creatinine Ratio: 20 (ref 12–28)
BUN: 20 mg/dL (ref 8–27)
Bilirubin Total: 0.5 mg/dL (ref 0.0–1.2)
CO2: 24 mmol/L (ref 20–29)
Calcium: 10.3 mg/dL (ref 8.7–10.3)
Chloride: 98 mmol/L (ref 96–106)
Creatinine, Ser: 0.99 mg/dL (ref 0.57–1.00)
GFR calc Af Amer: 70 mL/min/{1.73_m2} (ref >59)
GFR calc non Af Amer: 60 mL/min/{1.73_m2} (ref >59)
Globulin, Total: 3.3 g/dL (ref 1.5–4.5)
Glucose: 99 mg/dL (ref 65–99)
Potassium: 3.3 mmol/L — ABNORMAL LOW (ref 3.5–5.2)
Sodium: 140 mmol/L (ref 134–144)
Total Protein: 7.8 g/dL (ref 6.0–8.5)

## 2020-02-23 LAB — C-REACTIVE PROTEIN: CRP: 15 mg/L — ABNORMAL HIGH (ref 0–10)

## 2020-02-23 LAB — MULTIPLE MYELOMA PANEL, SERUM
Albumin SerPl Elph-Mcnc: 3.7 g/dL (ref 2.9–4.4)
Albumin/Glob SerPl: 1 (ref 0.7–1.7)
Alpha 1: 0.3 g/dL (ref 0.0–0.4)
Alpha2 Glob SerPl Elph-Mcnc: 1.1 g/dL — ABNORMAL HIGH (ref 0.4–1.0)
B-Globulin SerPl Elph-Mcnc: 1.4 g/dL — ABNORMAL HIGH (ref 0.7–1.3)
Gamma Glob SerPl Elph-Mcnc: 1.3 g/dL (ref 0.4–1.8)
Globulin, Total: 4.1 g/dL — ABNORMAL HIGH (ref 2.2–3.9)
IgA/Immunoglobulin A, Serum: 277 mg/dL (ref 87–352)
IgG (Immunoglobin G), Serum: 1379 mg/dL (ref 586–1602)
IgM (Immunoglobulin M), Srm: 35 mg/dL (ref 26–217)
M Protein SerPl Elph-Mcnc: 0.5 g/dL — ABNORMAL HIGH

## 2020-02-23 LAB — ANA W/REFLEX IF POSITIVE: Anti Nuclear Antibody (ANA): NEGATIVE

## 2020-02-23 LAB — VITAMIN D 25 HYDROXY (VIT D DEFICIENCY, FRACTURES): Vit D, 25-Hydroxy: 8.8 ng/mL — ABNORMAL LOW (ref 30.0–100.0)

## 2020-02-23 LAB — RPR: RPR Ser Ql: NONREACTIVE

## 2020-02-23 LAB — HIV ANTIBODY (ROUTINE TESTING W REFLEX): HIV Screen 4th Generation wRfx: NONREACTIVE

## 2020-02-23 LAB — CK: Total CK: 44 U/L (ref 32–182)

## 2020-02-23 LAB — TSH: TSH: 3.82 u[IU]/mL (ref 0.450–4.500)

## 2020-02-23 LAB — SEDIMENTATION RATE: Sed Rate: 74 mm/hr — ABNORMAL HIGH (ref 0–40)

## 2020-02-23 LAB — HGB A1C W/O EAG: Hgb A1c MFr Bld: 7.8 % — ABNORMAL HIGH (ref 4.8–5.6)

## 2020-02-29 ENCOUNTER — Telehealth: Payer: Self-pay | Admitting: Neurology

## 2020-02-29 ENCOUNTER — Ambulatory Visit: Payer: PRIVATE HEALTH INSURANCE

## 2020-02-29 ENCOUNTER — Telehealth: Payer: Self-pay | Admitting: *Deleted

## 2020-02-29 ENCOUNTER — Other Ambulatory Visit: Payer: Self-pay

## 2020-02-29 NOTE — Telephone Encounter (Signed)
Julia Medina, This Pt was in for her EGG and we had to reschedule due to no insurance.The Pt is asking when Dr Terrace Arabia will let them know about their lab results and wasn't sure what to respond with.  Thanks

## 2020-02-29 NOTE — Progress Notes (Signed)
Kindly inform the patient that most of the blood test which are back are normal except significantly low vitamin D levels and abnormal paraprotein in the blood.  She needs to make an appointment see your primary care physician to start vitamin D replacement and follow-up appointment with Dr. Terrace Arabia to discuss implications of abnormal paraprotein test

## 2020-02-29 NOTE — Telephone Encounter (Signed)
-----   Message from Micki Riley, MD sent at 02/29/2020  3:59 PM EST ----- Kindly inform the patient that most of the blood test which are back are normal except significantly low vitamin D levels and abnormal paraprotein in the blood.  She needs to make an appointment see your primary care physician to start vitamin D replacement and follow-up appointment with Dr. Terrace Arabia to discuss implications of abnormal paraprotein test

## 2020-02-29 NOTE — Telephone Encounter (Signed)
See separate note concerning lab results and rescheduling information.

## 2020-02-29 NOTE — Telephone Encounter (Signed)
I spoke to the patent, her husband and daughter (all on speaker phone). I provided them with the lab results. She was in agreement to follow up with her PCP.   She had an insurance issue today and was unable to complete her EEG. She is going to reschedule it.   She has a pending appt on 03/21/20 for NCV/EMG. She is aware that Dr. Terrace Arabia will further review her labs at that time.

## 2020-03-14 ENCOUNTER — Encounter: Payer: Self-pay | Admitting: Neurology

## 2020-03-21 ENCOUNTER — Encounter: Payer: PRIVATE HEALTH INSURANCE | Admitting: Neurology

## 2020-03-21 ENCOUNTER — Ambulatory Visit (INDEPENDENT_AMBULATORY_CARE_PROVIDER_SITE_OTHER): Payer: PRIVATE HEALTH INSURANCE | Admitting: Neurology

## 2020-03-21 ENCOUNTER — Other Ambulatory Visit: Payer: Self-pay

## 2020-03-21 ENCOUNTER — Telehealth: Payer: Self-pay | Admitting: Neurology

## 2020-03-21 DIAGNOSIS — R202 Paresthesia of skin: Secondary | ICD-10-CM

## 2020-03-21 DIAGNOSIS — G40209 Localization-related (focal) (partial) symptomatic epilepsy and epileptic syndromes with complex partial seizures, not intractable, without status epilepticus: Secondary | ICD-10-CM | POA: Diagnosis not present

## 2020-03-21 DIAGNOSIS — R269 Unspecified abnormalities of gait and mobility: Secondary | ICD-10-CM

## 2020-03-21 DIAGNOSIS — E559 Vitamin D deficiency, unspecified: Secondary | ICD-10-CM | POA: Diagnosis not present

## 2020-03-21 DIAGNOSIS — E1142 Type 2 diabetes mellitus with diabetic polyneuropathy: Secondary | ICD-10-CM | POA: Diagnosis not present

## 2020-03-21 MED ORDER — VITAMIN D (ERGOCALCIFEROL) 1.25 MG (50000 UNIT) PO CAPS: 50000.0000 [IU] | ORAL_CAPSULE | ORAL | 0 refills | Status: DC

## 2020-03-21 MED ORDER — DULOXETINE HCL 60 MG PO CPEP: 60.0000 mg | ORAL_CAPSULE | Freq: Every day | ORAL | 6 refills | Status: DC

## 2020-03-21 NOTE — Progress Notes (Signed)
No chief complaint on file.   HISTORICAL  Julia Medina is a 65 year old female, seen in request by primary care PA Jenel Lucks for evaluation of seizure, initial evaluation was with her husband on February 21, 2020  I reviewed and summarized the referring note.  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, on November 30, 2019, during sleep, her husband noticed that she has intermittent rhythmic shaking of right arm, leg, that were stopping few minutes, she can be awakened in between, last all night long, for 8 or 10 hours, eventually was brought to the emergency room next morning,  Upon presentation, her glucose level was 337, patient was given Ativan, also had a fever, confusion, elevated blood pressure tachycardia heart rate of 112  She was treated with broad spectrum antibiotic, and acyclovir, lumbar puncture showed WBC of 24, with neutrophil 89%, monocyte 11%, RBC of 155, HSV PCR was negative, was seen by neuro hospitalist,  A1c was more than 15.5, she was started on insulin and Metformin, Keppra 500 mg twice daily her symptoms quickly improved,  It was thought due to combination of 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, with newly diagnosis diabetes, hyperglycemia,  I personally reviewed MRI of the brain without contrast in September 2021, no acute ischemic changes MRA of the brain showed 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, intracranial atherosclerotic disease, with  severe left ICA stenosis at the cavernous segment  CT angiogram of the neck showed no significant large vessel disease  Laboratory evaluation showed negative HIV, West Nile virus, A1c was 15.5, normal free T4, procalcitonin, elevated WBC of 18.2, hemoglobin of 16.4, CMP showed creatinine of 0.79, lipid panel LDL of 125, cholesterol of 180,  Spinal fluid testing, negative HSV  type I, type II IgG, IgM, HSV PCR, varicella-zoster PCR, VDRL, VDRL, TP 41,  She is now back home, complains of generalized weakness, diffuse body achy pain, tolerating Keppra 500 mg twice a day well, complains of bilateral feet numbness tingling burning pain,  UPDATE Mar 21 2020: Laboratory evaluation showed A1c of 7.8, ESR of 74, mild elevated C-reactive protein of 15, CPK of 44, normal TSH, negative RPR, HIV, B12 256, vitamin D of 8.8, CMP showed potassium of 3.3, alkaline phosphate was mildly elevated 134, mild elevated M protein 0.5  She continue complains of bilateral feet paresthesia, but much improved with gabapentin, gabapentin 300 mg 3 times daily causing daytime drowsiness,  She return for electrodiagnostic study today, which showed evidence of mild axonal sensorimotor polyneuropathy, consistent with reported controlled diabetes, which has much improved, most recent A1c dropped from previous 15.5-7.8,  She has no recurrent seizure, tolerating Keppra 500 mg twice a day  REVIEW OF SYSTEMS: Full 14 system review of systems performed and notable only for as above All other review of systems were negative.  ALLERGIES: Allergies  Allergen Reactions  . Aspirin Rash  . Iodine Rash  . Penicillins Rash  . Sulfa Antibiotics Rash    HOME MEDICATIONS: Current Outpatient Medications  Medication Sig Dispense Refill  . acetaminophen (TYLENOL) 650 MG CR tablet Take 650-1,300 mg by mouth every 8 (eight) hours as needed for pain.    Marland Kitchen albuterol (PROVENTIL HFA;VENTOLIN HFA) 108 (90 Base) MCG/ACT inhaler Inhale 2 puffs into the lungs every 4 (four) hours as needed for wheezing or shortness of breath. 1 Inhaler 0  . blood glucose meter kit and supplies Dispense  based on patient and insurance preference. Check blood sugar once daily. (FOR ICD-10 E10.9, E11.9). 1 each 0  . clonazePAM (KLONOPIN) 0.5 MG tablet Take 1 tablet (0.5 mg total) by mouth 2 (two) times daily as needed for anxiety. 30 tablet  2  . clopidogrel (PLAVIX) 75 MG tablet Take 1 tablet (75 mg total) by mouth daily. 30 tablet 3  . furosemide (LASIX) 20 MG tablet Take 1 tablet (20 mg total) by mouth daily. 30 tablet 3  . gabapentin (NEURONTIN) 300 MG capsule Take 1 capsule (300 mg total) by mouth 3 (three) times daily. 90 capsule 11  . hydrochlorothiazide (HYDRODIURIL) 25 MG tablet Take 1 tablet (25 mg total) by mouth daily. 30 tablet 3  . insulin NPH Human (NOVOLIN N RELION) 100 UNIT/ML injection Inject 0.12 mLs (12 Units total) into the skin 2 (two) times daily before a meal. 10 mL 11  . Insulin Syringes, Disposable, U-100 0.5 ML MISC Use to inject insulin twice daily 60 each 11  . levETIRAcetam (KEPPRA) 500 MG tablet Take 1 tablet (500 mg total) by mouth 2 (two) times daily. 180 tablet 4  . metFORMIN (GLUCOPHAGE) 500 MG tablet Take 1 tablet (500 mg total) by mouth daily with breakfast. 30 tablet 3  . metoprolol tartrate (LOPRESSOR) 50 MG tablet Take 1 tablet (50 mg total) by mouth 2 (two) times daily. 60 tablet 3  . omeprazole (PRILOSEC) 20 MG capsule Take 20 mg by mouth daily.    . simvastatin (ZOCOR) 40 MG tablet Take 1 tablet (40 mg total) by mouth at bedtime. 30 tablet 3   No current facility-administered medications for this visit.    PAST MEDICAL HISTORY: Past Medical History:  Diagnosis Date  . Aneurysm ([place])   . CAD (coronary artery disease), native coronary artery    has stents  . COPD (chronic obstructive pulmonary disease) ([place])   . Diabetes ([place])   . Diverticulosis   . Hypertension   . Seizure ([place])     PAST SURGICAL HISTORY: Past Surgical History:  Procedure Laterality Date  . heart stents    . intestine     diverticulosis  . LUMBAR FUSION    . TUMOR EXCISION     right ovary - benign    FAMILY HISTORY: Family History  Problem Relation Age of Onset  . Other Mother        unsure of health history  . Other Father        unsure of health history  . Seizures Neg Hx     SOCIAL  HISTORY: Social History   Socioeconomic History  . Marital status: Married    Spouse name: Not on file  . Number of children: 2  . Years of education: two years of college  . Highest education level: Not on file  Occupational History  . Occupation: retired  Tobacco Use  . Smoking status: Current Every Day Smoker    Packs/day: 1.00    Years: 45.00    Pack years: 45.00  . Smokeless tobacco: Never Used  Substance and Sexual Activity  . Alcohol use: Yes    Comment: rare  . Drug use: Never  . Sexual activity: Not on file  Other Topics Concern  . Not on file  Social History Narrative   ** Merged History Encounter **       Left-handed. No daily use of caffeine. Left-handed.    Social Determinants of Health   Financial Resource Strain: Not on file  Food Insecurity:  Not on file  Transportation Needs: Not on file  Physical Activity: Not on file  Stress: Not on file  Social Connections: Not on file  Intimate Partner Violence: Not on file     PHYSICAL EXAM   There were no vitals filed for this visit. Not recorded     There is no height or weight on file to calculate BMI.  PHYSICAL EXAMNIATION:  Gen: NAD, conversant, well nourised, well groomed                     Cardiovascular: Regular rate rhythm, no peripheral edema, warm, nontender. Eyes: Conjunctivae clear without exudates or hemorrhage Neck: Supple, no carotid bruits. Pulmonary: Clear to auscultation bilaterally   NEUROLOGICAL EXAM:  MENTAL STATUS: Speech/cognition: Awake, alert, oriented to history taking and casual conversation  CRANIAL NERVES: CN II: Visual fields are full to confrontation. Pupils are round equal and briskly reactive to light. CN III, IV, VI: extraocular movement are normal. No ptosis. CN V: Facial sensation is intact to light touch CN VII: Face is symmetric with normal eye closure  CN VIII: Hearing is normal to causal conversation. CN IX, X: Phonation is normal. CN XI: Head  turning and shoulder shrug are intact  MOTOR: There is no pronator drift of out-stretched arms. Muscle bulk and tone are normal. Muscle strength is normal.  REFLEXES: Reflexes are 2+ and symmetric at the biceps, triceps, knees, and trace ankles. Plantar responses are flexor.  SENSORY: Length dependent decreased light touch, pinprick, vibratory sensation to ankle level   COORDINATION: There is no trunk or limb dysmetria noted.  GAIT/STANCE: She needs push-up to get up from seated position, antalgic,  DIAGNOSTIC DATA (LABS, IMAGING, TESTING) - I reviewed patient records, labs, notes, testing and imaging myself where available.   ASSESSMENT AND PLAN  Julia Medina is a 65 y.o. 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 and hyperglycemia, with glucose 337, newly diagnosed diabetes, A1c more than 15.5  Keep Keppra 500 mg twice daily  MRI of the brain with without contrast  EEG Peripheral neuropathy  Most consistent with her diabetes  Mild axonal sensorimotor polyneuropathy, confirmed by EMG nerve conduction study March 21, 2020,  Laboratory evaluation showed significant low vitamin D, only 8.9, was given prescription by her primary care physician  Borderline low B12, suggest over-the-counter B12 supplement 1000 mcg daily  Add on Cymbalta 60 mg daily, continue gabapentin up to 300 mg 3 times daily, may move the dose before bedtime for her reported insomnia  Gait abnormalities  Is receiving physical therapy   Marcial Pacas, M.D. Ph.D.  [facility] [address], [place], [place] [address] Ph: [phone] Fax: [phone]  CC:  Edwin Dada, MD [address] [place],  [place] [id number]

## 2020-03-21 NOTE — Telephone Encounter (Signed)
Please make sure she is on schedule for MRI of the brain, EEG as previously ordered

## 2020-03-21 NOTE — Telephone Encounter (Signed)
Note the order was sent to GI. They will reach out to the patient to schedule.

## 2020-03-22 ENCOUNTER — Telehealth: Payer: Self-pay | Admitting: Neurology

## 2020-03-22 NOTE — Telephone Encounter (Signed)
I called pt yesterday and lvm to call back to reschedule EEG

## 2020-04-04 ENCOUNTER — Other Ambulatory Visit: Payer: Self-pay

## 2020-04-11 NOTE — Procedures (Signed)
                  Full Name: Tansy Lorek Gender: Female MRN #: [id number] Date of Birth: [birth year]    Visit Date: 03/21/2020 10:20 Age: 65 Years Examining Physician: Levert Feinstein, MD  Referring Physician: Levert Feinstein, MD History: 65 year old female, with history of poorly controlled diabetes, complains of bilateral feet paresthesia  Summary of the test: Nerve conduction study: Bilateral sural sensory responses were absent.  Bilateral superficial peroneal sensory response showed mild to moderately decreased snap amplitude.  Bilateral tibial and the left peroneal to EDB motor responses were normal, right peroneal to EDB motor response showed mildly decreased conduction velocity.  Right median, ulnar sensory and motor responses were normal.  Electromyography:  Selected needle examination of right lower extremity muscles were normal.    Conclusion: This is a mild abnormal study.  There is electrodiagnostic evidence of mild axonal length dependent sensorimotor polyneuropathy.    Levert Feinstein, M.D. PhD  [facility] [address] [place], [place] [address] Tel: [phone] Fax: [phone]  Verbal informed consent was obtained from the patient, patient was informed of potential risk of procedure, including bruising, bleeding, hematoma formation, infection, muscle weakness, muscle pain, numbness, among others.        EMG Summary Table    Spontaneous MUAP Recruitment  Muscle IA Fib PSW Fasc Other Amp Dur. Poly Pattern  R. Tibialis anterior Normal None None None _______ Normal Normal Normal Normal  R. Tibialis posterior Normal None None None _______ Normal Normal Normal Normal  R. Peroneus longus Normal None None None _______ Normal Normal Normal Normal  R. Gastrocnemius (Medial head) Normal None None None _______ Normal Normal Normal Normal  R. Vastus lateralis Normal None None None _______ Normal Normal Normal Normal

## 2020-07-18 ENCOUNTER — Encounter: Payer: Self-pay | Admitting: Neurology

## 2020-07-18 ENCOUNTER — Ambulatory Visit: Payer: PRIVATE HEALTH INSURANCE | Admitting: Neurology

## 2020-07-18 NOTE — Progress Notes (Deleted)
No chief complaint on file.   HISTORICAL  Julia Medina is a 65 year old female, seen in request by primary care PA Jenel Lucks for evaluation of seizure, initial evaluation was with her husband on February 21, 2020  I reviewed and summarized the referring note.  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, on November 30, 2019, during sleep, her husband noticed that she has intermittent rhythmic shaking of right arm, leg, that were stopping few minutes, she can be awakened in between, last all night long, for 8 or 10 hours, eventually was brought to the emergency room next morning,  Upon presentation, her glucose level was 337, patient was given Ativan, also had a fever, confusion, elevated blood pressure tachycardia heart rate of 112  She was treated with broad spectrum antibiotic, and acyclovir, lumbar puncture showed WBC of 24, with neutrophil 89%, monocyte 11%, RBC of 155, HSV PCR was negative, was seen by neuro hospitalist,  A1c was more than 15.5, she was started on insulin and Metformin, Keppra 500 mg twice daily her symptoms quickly improved,  It was thought due to combination of 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, with newly diagnosis diabetes, hyperglycemia,  I personally reviewed MRI of the brain without contrast in September 2021, no acute ischemic changes MRA of the brain showed 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, intracranial atherosclerotic disease, with  severe left ICA stenosis at the cavernous segment  CT angiogram of the neck showed no significant large vessel disease  Laboratory evaluation showed negative HIV, West Nile virus, A1c was 15.5, normal free T4, procalcitonin, elevated WBC of 18.2, hemoglobin of 16.4, CMP showed creatinine of 0.79, lipid panel LDL of 125, cholesterol of 180,  Spinal fluid testing, negative HSV  type I, type II IgG, IgM, HSV PCR, varicella-zoster PCR, VDRL, VDRL, TP 41,  She is now back home, complains of generalized weakness, diffuse body achy pain, tolerating Keppra 500 mg twice a day well, complains of bilateral feet numbness tingling burning pain,  UPDATE Mar 21 2020: Laboratory evaluation showed A1c of 7.8, ESR of 74, mild elevated C-reactive protein of 15, CPK of 44, normal TSH, negative RPR, HIV, B12 256, vitamin D of 8.8, CMP showed potassium of 3.3, alkaline phosphate was mildly elevated 134, mild elevated M protein 0.5  She continue complains of bilateral feet paresthesia, but much improved with gabapentin, gabapentin 300 mg 3 times daily causing daytime drowsiness,  She return for electrodiagnostic study today, which showed evidence of mild axonal sensorimotor polyneuropathy, consistent with reported controlled diabetes, which has much improved, most recent A1c dropped from previous 15.5-7.8,  She has no recurrent seizure, tolerating Keppra 500 mg twice a day  Update Jul 18, 2020 SS:   REVIEW OF SYSTEMS: Full 14 system review of systems performed and notable only for as above All other review of systems were negative.  ALLERGIES: Allergies  Allergen Reactions  . Aspirin Rash  . Iodine Rash  . Penicillins Rash  . Sulfa Antibiotics Rash    HOME MEDICATIONS: Current Outpatient Medications  Medication Sig Dispense Refill  . acetaminophen (TYLENOL) 650 MG CR tablet Take 650-1,300 mg by mouth every 8 (eight) hours as needed for pain.    Marland Kitchen albuterol (PROVENTIL HFA;VENTOLIN HFA) 108 (90 Base) MCG/ACT inhaler Inhale 2 puffs into the lungs every 4 (four) hours as needed for wheezing or shortness of breath. 1 Inhaler 0  .  blood glucose meter kit and supplies Dispense based on patient and insurance preference. Check blood sugar once daily. (FOR ICD-10 E10.9, E11.9). 1 each 0  . clonazePAM (KLONOPIN) 0.5 MG tablet Take 1 tablet (0.5 mg total) by mouth 2 (two) times daily as  needed for anxiety. 30 tablet 2  . clopidogrel (PLAVIX) 75 MG tablet Take 1 tablet (75 mg total) by mouth daily. 30 tablet 3  . DULoxetine (CYMBALTA) 60 MG capsule Take 1 capsule (60 mg total) by mouth daily. 30 capsule 6  . furosemide (LASIX) 20 MG tablet Take 1 tablet (20 mg total) by mouth daily. 30 tablet 3  . gabapentin (NEURONTIN) 300 MG capsule Take 1 capsule (300 mg total) by mouth 3 (three) times daily. 90 capsule 11  . hydrochlorothiazide (HYDRODIURIL) 25 MG tablet Take 1 tablet (25 mg total) by mouth daily. 30 tablet 3  . insulin NPH Human (NOVOLIN N RELION) 100 UNIT/ML injection Inject 0.12 mLs (12 Units total) into the skin 2 (two) times daily before a meal. 10 mL 11  . Insulin Syringes, Disposable, U-100 0.5 ML MISC Use to inject insulin twice daily 60 each 11  . levETIRAcetam (KEPPRA) 500 MG tablet Take 1 tablet (500 mg total) by mouth 2 (two) times daily. 180 tablet 4  . metFORMIN (GLUCOPHAGE) 500 MG tablet Take 1 tablet (500 mg total) by mouth daily with breakfast. 30 tablet 3  . metoprolol tartrate (LOPRESSOR) 50 MG tablet Take 1 tablet (50 mg total) by mouth 2 (two) times daily. 60 tablet 3  . omeprazole (PRILOSEC) 20 MG capsule Take 20 mg by mouth daily.    . simvastatin (ZOCOR) 40 MG tablet Take 1 tablet (40 mg total) by mouth at bedtime. 30 tablet 3   No current facility-administered medications for this visit.    PAST MEDICAL HISTORY: Past Medical History:  Diagnosis Date  . Aneurysm ([place])   . CAD (coronary artery disease), native coronary artery    has stents  . COPD (chronic obstructive pulmonary disease) ([place])   . Diabetes ([place])   . Diverticulosis   . Hypertension   . Seizure ([place])     PAST SURGICAL HISTORY: Past Surgical History:  Procedure Laterality Date  . heart stents    . intestine     diverticulosis  . LUMBAR FUSION    . TUMOR EXCISION     right ovary - benign    FAMILY HISTORY: Family History  Problem Relation Age of Onset  . Other Mother         unsure of health history  . Other Father        unsure of health history  . Seizures Neg Hx     SOCIAL HISTORY: Social History   Socioeconomic History  . Marital status: Married    Spouse name: Not on file  . Number of children: 2  . Years of education: two years of college  . Highest education level: Not on file  Occupational History  . Occupation: retired  Tobacco Use  . Smoking status: Current Every Day Smoker    Packs/day: 1.00    Years: 45.00    Pack years: 45.00  . Smokeless tobacco: Never Used  Substance and Sexual Activity  . Alcohol use: Yes    Comment: rare  . Drug use: Never  . Sexual activity: Not on file  Other Topics Concern  . Not on file  Social History Narrative   ** Merged History Encounter **  Left-handed. No daily use of caffeine. Left-handed.    Social Determinants of Health   Financial Resource Strain: Not on file  Food Insecurity: Not on file  Transportation Needs: Not on file  Physical Activity: Not on file  Stress: Not on file  Social Connections: Not on file  Intimate Partner Violence: Not on file     PHYSICAL EXAM   There were no vitals filed for this visit. Not recorded     There is no height or weight on file to calculate BMI.  PHYSICAL EXAMNIATION:  Gen: NAD, conversant, well nourised, well groomed                     Cardiovascular: Regular rate rhythm, no peripheral edema, warm, nontender. Eyes: Conjunctivae clear without exudates or hemorrhage Neck: Supple, no carotid bruits. Pulmonary: Clear to auscultation bilaterally   NEUROLOGICAL EXAM:  MENTAL STATUS: Speech/cognition: Awake, alert, oriented to history taking and casual conversation  CRANIAL NERVES: CN II: Visual fields are full to confrontation. Pupils are round equal and briskly reactive to light. CN III, IV, VI: extraocular movement are normal. No ptosis. CN V: Facial sensation is intact to light touch CN VII: Face is symmetric with  normal eye closure  CN VIII: Hearing is normal to causal conversation. CN IX, X: Phonation is normal. CN XI: Head turning and shoulder shrug are intact  MOTOR: There is no pronator drift of out-stretched arms. Muscle bulk and tone are normal. Muscle strength is normal.  REFLEXES: Reflexes are 2+ and symmetric at the biceps, triceps, knees, and trace ankles. Plantar responses are flexor.  SENSORY: Length dependent decreased light touch, pinprick, vibratory sensation to ankle level   COORDINATION: There is no trunk or limb dysmetria noted.  GAIT/STANCE: She needs push-up to get up from seated position, antalgic,  DIAGNOSTIC DATA (LABS, IMAGING, TESTING) - I reviewed patient records, labs, notes, testing and imaging myself where available.   ASSESSMENT AND PLAN  Julia Medina is a 66 y.o. 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 and hyperglycemia, with glucose 337, newly diagnosed diabetes, A1c more than 15.5  Keep Keppra 500 mg twice daily  MRI of the brain with without contrast  EEG Peripheral neuropathy  Most consistent with her diabetes  Mild axonal sensorimotor polyneuropathy, confirmed by EMG nerve conduction study March 21, 2020,  Laboratory evaluation showed significant low vitamin D, only 8.9, was given prescription by her primary care physician  Borderline low B12, suggest over-the-counter B12 supplement 1000 mcg daily  Add on Cymbalta 60 mg daily, continue gabapentin up to 300 mg 3 times daily, may move the dose before bedtime for her reported insomnia  Gait abnormalities  Is receiving physical therapy   Marcial Pacas, M.D. Ph.D.  [facility] [address], [place], [place] [address] Ph: [phone] Fax: [phone]  CC:  Edwin Dada, MD [place],  [place] [phone]

## 2020-09-19 ENCOUNTER — Other Ambulatory Visit: Payer: Self-pay | Admitting: Neurology

## 2020-10-23 ENCOUNTER — Emergency Department (HOSPITAL_BASED_OUTPATIENT_CLINIC_OR_DEPARTMENT_OTHER)
Admission: EM | Admit: 2020-10-23 | Discharge: 2020-10-24 | Disposition: A | Discharge status: Home / Self Care | Payer: Medicare Other | Attending: Emergency Medicine | Admitting: Emergency Medicine

## 2020-10-23 ENCOUNTER — Other Ambulatory Visit: Payer: Self-pay

## 2020-10-23 ENCOUNTER — Encounter (HOSPITAL_BASED_OUTPATIENT_CLINIC_OR_DEPARTMENT_OTHER): Payer: Self-pay

## 2020-10-23 DIAGNOSIS — R42 Dizziness and giddiness: Secondary | ICD-10-CM | POA: Diagnosis not present

## 2020-10-23 DIAGNOSIS — Z79899 Other long term (current) drug therapy: Secondary | ICD-10-CM | POA: Insufficient documentation

## 2020-10-23 DIAGNOSIS — I2510 Atherosclerotic heart disease of native coronary artery without angina pectoris: Secondary | ICD-10-CM | POA: Insufficient documentation

## 2020-10-23 DIAGNOSIS — F17210 Nicotine dependence, cigarettes, uncomplicated: Secondary | ICD-10-CM | POA: Insufficient documentation

## 2020-10-23 DIAGNOSIS — J449 Chronic obstructive pulmonary disease, unspecified: Secondary | ICD-10-CM | POA: Insufficient documentation

## 2020-10-23 DIAGNOSIS — Z7984 Long term (current) use of oral hypoglycemic drugs: Secondary | ICD-10-CM | POA: Insufficient documentation

## 2020-10-23 DIAGNOSIS — E1140 Type 2 diabetes mellitus with diabetic neuropathy, unspecified: Secondary | ICD-10-CM | POA: Insufficient documentation

## 2020-10-23 DIAGNOSIS — I10 Essential (primary) hypertension: Secondary | ICD-10-CM | POA: Insufficient documentation

## 2020-10-23 DIAGNOSIS — Z794 Long term (current) use of insulin: Secondary | ICD-10-CM | POA: Insufficient documentation

## 2020-10-23 DIAGNOSIS — H04301 Unspecified dacryocystitis of right lacrimal passage: Secondary | ICD-10-CM | POA: Insufficient documentation

## 2020-10-23 DIAGNOSIS — R519 Headache, unspecified: Secondary | ICD-10-CM | POA: Diagnosis not present

## 2020-10-23 DIAGNOSIS — H5789 Other specified disorders of eye and adnexa: Secondary | ICD-10-CM | POA: Diagnosis present

## 2020-10-23 LAB — BASIC METABOLIC PANEL
Anion gap: 10 (ref 5–15)
BUN: 19 mg/dL (ref 8–23)
CO2: 23 mmol/L (ref 22–32)
Calcium: 9.2 mg/dL (ref 8.9–10.3)
Chloride: 103 mmol/L (ref 98–111)
Creatinine, Ser: 0.96 mg/dL (ref 0.44–1.00)
GFR, Estimated: >60 mL/min (ref >60)
Glucose, Bld: 136 mg/dL — ABNORMAL HIGH (ref 70–99)
Potassium: 4.3 mmol/L (ref 3.5–5.1)
Sodium: 136 mmol/L (ref 135–145)

## 2020-10-23 LAB — CBC
HCT: 45.1 % (ref 36.0–46.0)
Hemoglobin: 15.4 g/dL — ABNORMAL HIGH (ref 12.0–15.0)
MCH: 30.9 pg (ref 26.0–34.0)
MCHC: 34.1 g/dL (ref 30.0–36.0)
MCV: 90.6 fL (ref 80.0–100.0)
Platelets: 323 10*3/uL (ref 150–400)
RBC: 4.98 MIL/uL (ref 3.87–5.11)
RDW: 15.1 % (ref 11.5–15.5)
WBC: 12.5 10*3/uL — ABNORMAL HIGH (ref 4.0–10.5)
nRBC: 0 % (ref 0.0–0.2)

## 2020-10-23 NOTE — ED Triage Notes (Signed)
Pt reports multiple episodes of lightheadedness and dizziness all day associated with sinus issues, headache, E ear ache without relief from Sudafed and other decongestants.

## 2020-10-24 MED ORDER — DOXYCYCLINE HYCLATE 100 MG PO CAPS: 100.0000 mg | ORAL_CAPSULE | Freq: Two times a day (BID) | ORAL | 0 refills | Status: DC

## 2020-10-24 MED ORDER — ERYTHROMYCIN 5 MG/GM OP OINT
TOPICAL_OINTMENT | Freq: Four times a day (QID) | OPHTHALMIC | Status: DC
  Administered 2020-10-24: 1 via OPHTHALMIC
  Filled 2020-10-24: qty 3.5

## 2020-10-24 MED ORDER — ERYTHROMYCIN 5 MG/GM OP OINT: TOPICAL_OINTMENT | OPHTHALMIC | 0 refills | Status: DC

## 2020-10-24 MED ORDER — DOXYCYCLINE HYCLATE 100 MG PO TABS
100.0000 mg | ORAL_TABLET | Freq: Once | ORAL | Status: AC
  Administered 2020-10-24: 100 mg via ORAL
  Filled 2020-10-24: qty 1

## 2020-10-24 NOTE — ED Provider Notes (Signed)
[facility]ENCY DEPARTMENT Provider Note   CSN: [id number] Arrival date & time: 10/23/20  2256     History Chief Complaint  Patient presents with   Dizziness    Julia Medina is a 65 y.o. female.  The history is provided by the patient and medical records.  Dizziness Julia Medina is a 65 y.o. female who presents to the Emergency Department complaining of dizziness and facial pain. She states that about five days ago she started feeling pain to the right side of her face. Pain is located on the right ear across her right maxillary sinus involving her right eye. She has been experiencing watery drainage from the right eye. She also has right-sided nasal drainage. She has been taking Tylenol and a decongestant without significant relief. Today she started to feel dizzy as well. The dizziness is described as an off-balance sensation that comes and goes. No fever. She does have a history of recurrent sinus infections and feels like this is similar. No vision changes. No numbness, weakness, nausea, vomiting, difficulty breathing, chest pain.      Past Medical History:  Diagnosis Date   Aneurysm ([place])    CAD (coronary artery disease), native coronary artery    has stents   COPD (chronic obstructive pulmonary disease) (HCC)    Diabetes ([place])    Diverticulosis    Hypertension    Seizure [facility])     Patient Active Problem List   Diagnosis Date Noted   Vitamin D deficiency 03/21/2020   Gait abnormality 03/21/2020   Partial symptomatic epilepsy with complex partial seizures, not intractable, without status epilepticus ([place]) 02/21/2020   DM type 2 with diabetic peripheral neuropathy ([place]) 72/53/6644   Acute metabolic encephalopathy 03/47/4259   Sepsis ([place]) 11/30/2019   Hypertension    COPD (chronic obstructive pulmonary disease) ([place])    CAD (coronary artery disease), native coronary artery     Past Surgical History:  Procedure Laterality Date   heart  stents     intestine     diverticulosis   LUMBAR FUSION     TUMOR EXCISION     right ovary - benign     OB History   No obstetric history on file.     Family History  Problem Relation Age of Onset   Other Mother        unsure of health history   Other Father        unsure of health history   Seizures Neg Hx     Social History   Tobacco Use   Smoking status: Every Day    Packs/day: 1.00    Years: 45.00    Pack years: 45.00    Types: Cigarettes   Smokeless tobacco: Never  Substance Use Topics   Alcohol use: Yes    Comment: rare   Drug use: Never    Home Medications Prior to Admission medications   Medication Sig Start Date End Date Taking? Authorizing Provider  doxycycline (VIBRAMYCIN) 100 MG capsule Take 1 capsule (100 mg total) by mouth 2 (two) times daily. 10/24/20  Yes Quintella Reichert, MD  erythromycin ophthalmic ointment Place a 1/2 inch ribbon of ointment into the lower affected eyelid up to four times daily. 10/24/20  Yes Quintella Reichert, MD  acetaminophen (TYLENOL) 650 MG CR tablet Take 650-1,300 mg by mouth every 8 (eight) hours as needed for pain.    [provider]  albuterol (PROVENTIL HFA;VENTOLIN HFA) 108 (90 Base) MCG/ACT inhaler Inhale 2 puffs into  the lungs every 4 (four) hours as needed for wheezing or shortness of breath. 12/02/16   Lawyer, Harrell Gave, PA-C  blood glucose meter kit and supplies Dispense based on patient and insurance preference. Check blood sugar once daily. (FOR ICD-10 E10.9, E11.9). 12/03/19   Danford, Suann Larry, MD  clonazePAM (KLONOPIN) 0.5 MG tablet Take 1 tablet (0.5 mg total) by mouth 2 (two) times daily as needed for anxiety. 02/21/20   Marcial Pacas, MD  clopidogrel (PLAVIX) 75 MG tablet Take 1 tablet (75 mg total) by mouth daily. 12/03/19   Danford, Suann Larry, MD  DULoxetine (CYMBALTA) 60 MG capsule Take 1 capsule (60 mg total) by mouth daily. Please call [phone] to schedule appt for further refill or may request  from PCP. 09/19/20   Marcial Pacas, MD  furosemide (LASIX) 20 MG tablet Take 1 tablet (20 mg total) by mouth daily. 12/03/19   Danford, Suann Larry, MD  gabapentin (NEURONTIN) 300 MG capsule Take 1 capsule (300 mg total) by mouth 3 (three) times daily. 02/21/20   Marcial Pacas, MD  hydrochlorothiazide (HYDRODIURIL) 25 MG tablet Take 1 tablet (25 mg total) by mouth daily. 12/03/19   Danford, Suann Larry, MD  insulin NPH Human (NOVOLIN N RELION) 100 UNIT/ML injection Inject 0.12 mLs (12 Units total) into the skin 2 (two) times daily before a meal. 12/03/19   Danford, Suann Larry, MD  Insulin Syringes, Disposable, U-100 0.5 ML MISC Use to inject insulin twice daily 12/03/19   Danford, Suann Larry, MD  levETIRAcetam (KEPPRA) 500 MG tablet Take 1 tablet (500 mg total) by mouth 2 (two) times daily. 02/21/20   Marcial Pacas, MD  metFORMIN (GLUCOPHAGE) 500 MG tablet Take 1 tablet (500 mg total) by mouth daily with breakfast. 12/04/19   Danford, Suann Larry, MD  metoprolol tartrate (LOPRESSOR) 50 MG tablet Take 1 tablet (50 mg total) by mouth 2 (two) times daily. 12/03/19   Danford, Suann Larry, MD  omeprazole (PRILOSEC) 20 MG capsule Take 20 mg by mouth daily.    [provider]  simvastatin (ZOCOR) 40 MG tablet Take 1 tablet (40 mg total) by mouth at bedtime. 12/03/19   Danford, Suann Larry, MD    Allergies    Aspirin, Iodine, Amoxicillin, Penicillins, and Sulfa antibiotics  Review of Systems   Review of Systems  Neurological:  Positive for dizziness.  All other systems reviewed and are negative.  Physical Exam Updated Vital Signs BP 118/60   Pulse 66   Temp 98.4 F (36.9 C) (Oral)   Resp (!) 22   Ht 5' 5"  (1.651 m)   Wt 98.9 kg   SpO2 97%   BMI 36.28 kg/m   Physical Exam Vitals and nursing note reviewed.  Constitutional:      Appearance: She is well-developed.  HENT:     Head: Normocephalic and atraumatic.     Comments: Pupils equal round and reactive, EOM I. TMs clear  bilaterally. There is mild tenderness to palpation over the right maxillary sinus and there is also mild soft tissue swelling and tenderness over the right lacrimal duct. No significant exudate from the lacrimal duct. No significant nasal congestion. No erythema or edema in the posterior oropharynx. Cardiovascular:     Rate and Rhythm: Normal rate and regular rhythm.     Heart sounds: No murmur heard. Pulmonary:     Effort: Pulmonary effort is normal. No respiratory distress.     Breath sounds: Normal breath sounds.  Abdominal:     Palpations: Abdomen is  soft.     Tenderness: There is no abdominal tenderness. There is no guarding or rebound.  Musculoskeletal:        General: No tenderness.  Skin:    General: Skin is warm and dry.  Neurological:     Mental Status: She is alert and oriented to person, place, and time.     Comments: EOM I. No asymmetry of facial movements. Five out of five strength in all four extremities with sensation to light touch intact in all four extremities. No ataxia on finger to nose bilaterally.  Psychiatric:        Behavior: Behavior normal.    ED Results / Procedures / Treatments   Labs (all labs ordered are listed, but only abnormal results are displayed) Labs Reviewed  BASIC METABOLIC PANEL - Abnormal; Notable for the following components:      Result Value   Glucose, Bld 136 (*)    All other components within normal limits  CBC - Abnormal; Notable for the following components:   WBC 12.5 (*)    Hemoglobin 15.4 (*)    All other components within normal limits  URINALYSIS, ROUTINE W REFLEX MICROSCOPIC  CBG MONITORING, ED    EKG EKG Interpretation  Date/Time:  Tuesday October 23 2020 23:06:55 EDT Ventricular Rate:  69 PR Interval:  160 QRS Duration: 96 QT Interval:  410 QTC Calculation: 439 R Axis:   60 Text Interpretation: Normal sinus rhythm Nonspecific T wave abnormality Abnormal ECG Confirmed by Quintella Reichert [phone]) on 10/23/2020 11:39:14  PM  Radiology No results found.  Procedures Procedures   Medications Ordered in ED Medications  erythromycin ophthalmic ointment (has no administration in time range)  doxycycline (VIBRA-TABS) tablet 100 mg (has no administration in time range)    ED Course  I have reviewed the triage vital signs and the nursing notes.  Pertinent labs & imaging results that were available during my care of the patient were reviewed by me and considered in my medical decision making (see chart for details).    MDM Rules/Calculators/A&P                          patient with history of diabetes, seizures, coronary artery disease here for evaluation of facial pain, dizziness. She is non-toxic appearing on evaluation with no focal neurologic deficits. Examination is concerning for early dacrocystitis. She is non-toxic appearing on evaluation. Will start on topical antibiotics as well as oral antibiotics given her diabetes. Discussed follow-up with ophthalmology and return precautions.  presentation is not consistent with CVA, sepsis Final Clinical Impression(s) / ED Diagnoses Final diagnoses:  Dacrocystitis, right    Rx / DC Orders ED Discharge Orders          Ordered    doxycycline (VIBRAMYCIN) 100 MG capsule  2 times daily        10/24/20 0025    erythromycin ophthalmic ointment        10/24/20 Augustin Coupe, MD 10/24/20 [phone]

## 2020-12-01 ENCOUNTER — Other Ambulatory Visit: Payer: Self-pay

## 2020-12-01 ENCOUNTER — Emergency Department (HOSPITAL_BASED_OUTPATIENT_CLINIC_OR_DEPARTMENT_OTHER)
Admission: EM | Admit: 2020-12-01 | Discharge: 2020-12-01 | Disposition: A | Discharge status: Home / Self Care | Payer: Medicare Other | Attending: Emergency Medicine | Admitting: Emergency Medicine

## 2020-12-01 ENCOUNTER — Emergency Department (HOSPITAL_BASED_OUTPATIENT_CLINIC_OR_DEPARTMENT_OTHER): Payer: Medicare Other

## 2020-12-01 ENCOUNTER — Encounter (HOSPITAL_BASED_OUTPATIENT_CLINIC_OR_DEPARTMENT_OTHER): Payer: Self-pay | Admitting: *Deleted

## 2020-12-01 DIAGNOSIS — J449 Chronic obstructive pulmonary disease, unspecified: Secondary | ICD-10-CM | POA: Insufficient documentation

## 2020-12-01 DIAGNOSIS — R1033 Periumbilical pain: Secondary | ICD-10-CM

## 2020-12-01 DIAGNOSIS — Z7902 Long term (current) use of antithrombotics/antiplatelets: Secondary | ICD-10-CM | POA: Insufficient documentation

## 2020-12-01 DIAGNOSIS — I10 Essential (primary) hypertension: Secondary | ICD-10-CM | POA: Insufficient documentation

## 2020-12-01 DIAGNOSIS — Z79899 Other long term (current) drug therapy: Secondary | ICD-10-CM | POA: Diagnosis not present

## 2020-12-01 DIAGNOSIS — R112 Nausea with vomiting, unspecified: Secondary | ICD-10-CM | POA: Diagnosis not present

## 2020-12-01 DIAGNOSIS — I2510 Atherosclerotic heart disease of native coronary artery without angina pectoris: Secondary | ICD-10-CM | POA: Diagnosis not present

## 2020-12-01 DIAGNOSIS — Z7984 Long term (current) use of oral hypoglycemic drugs: Secondary | ICD-10-CM | POA: Insufficient documentation

## 2020-12-01 DIAGNOSIS — Z794 Long term (current) use of insulin: Secondary | ICD-10-CM | POA: Insufficient documentation

## 2020-12-01 DIAGNOSIS — E119 Type 2 diabetes mellitus without complications: Secondary | ICD-10-CM | POA: Diagnosis not present

## 2020-12-01 DIAGNOSIS — F17210 Nicotine dependence, cigarettes, uncomplicated: Secondary | ICD-10-CM | POA: Diagnosis not present

## 2020-12-01 LAB — CBC WITH DIFFERENTIAL/PLATELET
Abs Immature Granulocytes: 0.08 10*3/uL — ABNORMAL HIGH (ref 0.00–0.07)
Basophils Absolute: 0.1 10*3/uL (ref 0.0–0.1)
Basophils Relative: 1 %
Eosinophils Absolute: 0.2 10*3/uL (ref 0.0–0.5)
Eosinophils Relative: 1 %
HCT: 44.2 % (ref 36.0–46.0)
Hemoglobin: 15.3 g/dL — ABNORMAL HIGH (ref 12.0–15.0)
Immature Granulocytes: 1 %
Lymphocytes Relative: 19 %
Lymphs Abs: 2.6 10*3/uL (ref 0.7–4.0)
MCH: 30.8 pg (ref 26.0–34.0)
MCHC: 34.6 g/dL (ref 30.0–36.0)
MCV: 88.9 fL (ref 80.0–100.0)
Monocytes Absolute: 0.7 10*3/uL (ref 0.1–1.0)
Monocytes Relative: 5 %
Neutro Abs: 10.1 10*3/uL — ABNORMAL HIGH (ref 1.7–7.7)
Neutrophils Relative %: 73 %
Platelets: 346 10*3/uL (ref 150–400)
RBC: 4.97 MIL/uL (ref 3.87–5.11)
RDW: 14.6 % (ref 11.5–15.5)
WBC: 13.7 10*3/uL — ABNORMAL HIGH (ref 4.0–10.5)
nRBC: 0 % (ref 0.0–0.2)

## 2020-12-01 LAB — COMPREHENSIVE METABOLIC PANEL
ALT: 16 U/L (ref 0–44)
AST: 21 U/L (ref 15–41)
Albumin: 3.7 g/dL (ref 3.5–5.0)
Alkaline Phosphatase: 101 U/L (ref 38–126)
Anion gap: 8 (ref 5–15)
BUN: 17 mg/dL (ref 8–23)
CO2: 26 mmol/L (ref 22–32)
Calcium: 9.5 mg/dL (ref 8.9–10.3)
Chloride: 101 mmol/L (ref 98–111)
Creatinine, Ser: 1.1 mg/dL — ABNORMAL HIGH (ref 0.44–1.00)
GFR, Estimated: 56 mL/min — ABNORMAL LOW (ref >60)
Glucose, Bld: 160 mg/dL — ABNORMAL HIGH (ref 70–99)
Potassium: 4.1 mmol/L (ref 3.5–5.1)
Sodium: 135 mmol/L (ref 135–145)
Total Bilirubin: 0.5 mg/dL (ref 0.3–1.2)
Total Protein: 7.5 g/dL (ref 6.5–8.1)

## 2020-12-01 LAB — LIPASE, BLOOD: Lipase: 33 U/L (ref 11–51)

## 2020-12-01 IMAGING — CT CT ABD-PELV W/O CM
2 of 4 series · 17 of 46 positions shown, 19 images · non-contrast
Comparison: [DATE]

CLINICAL DATA: Acute, nonlocalized abdominal pain

EXAM:
CT ABDOMEN AND PELVIS WITHOUT CONTRAST
TECHNIQUE: Multidetector CT imaging of the abdomen and pelvis was performed
following the standard protocol without IV contrast.

[Series 2: axial st · axial · 0.98mm/px · z∈[+711,+1151]mm · 14 of 96 slices shown, 16 images]
[im 4/96  soft-tissue]
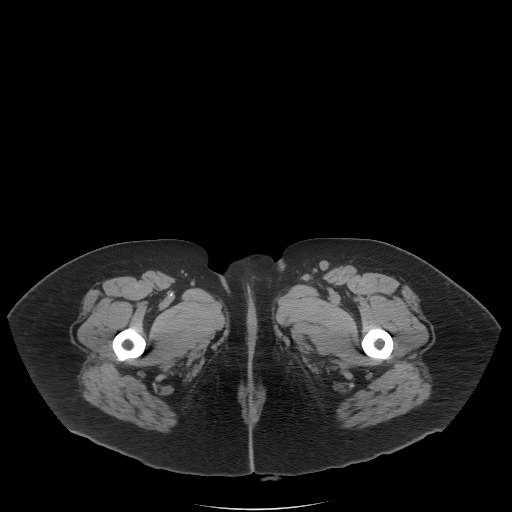
[im 4/96  bone]
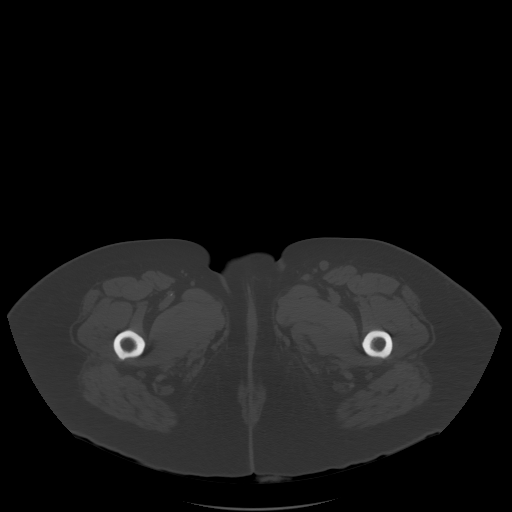
[im 12/96  soft-tissue]
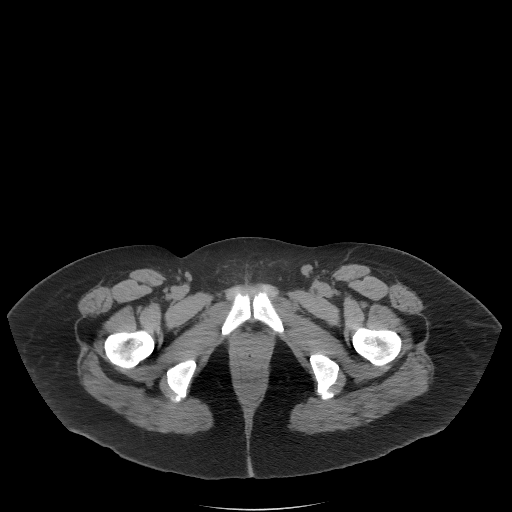
[im 20/96  soft-tissue]
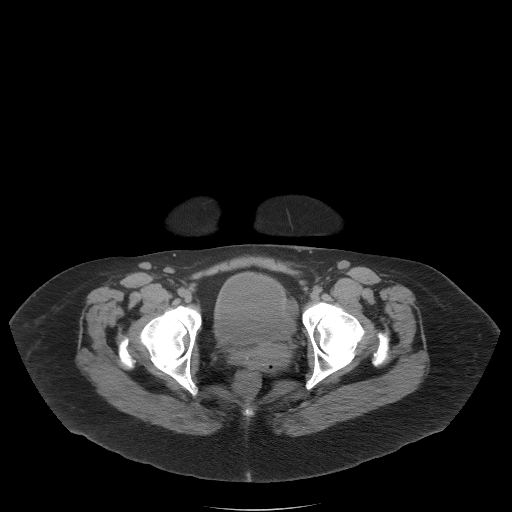
[im 24/96  soft-tissue]
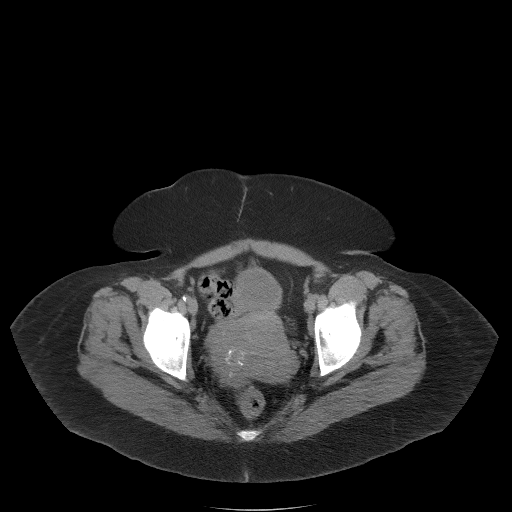
[im 32/96  soft-tissue]
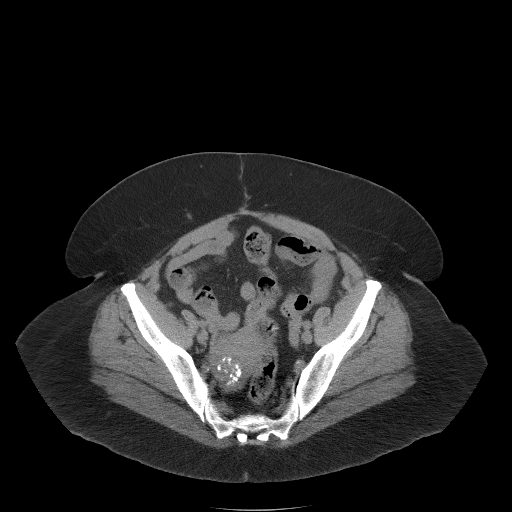
[im 40/96  soft-tissue]
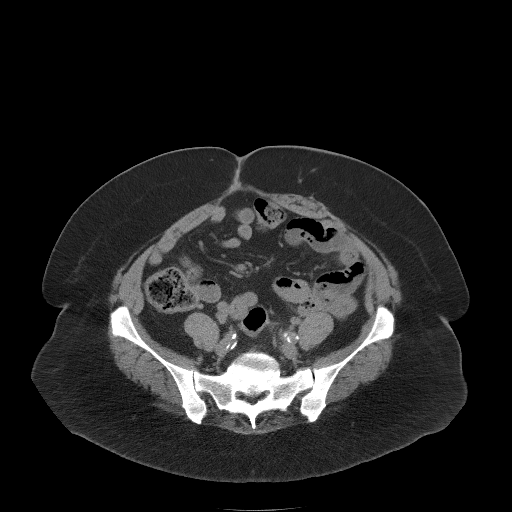
[im 44/96  soft-tissue]
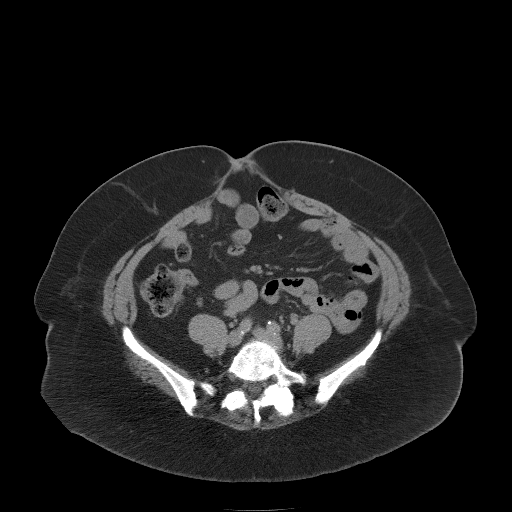
[im 52/96  soft-tissue]
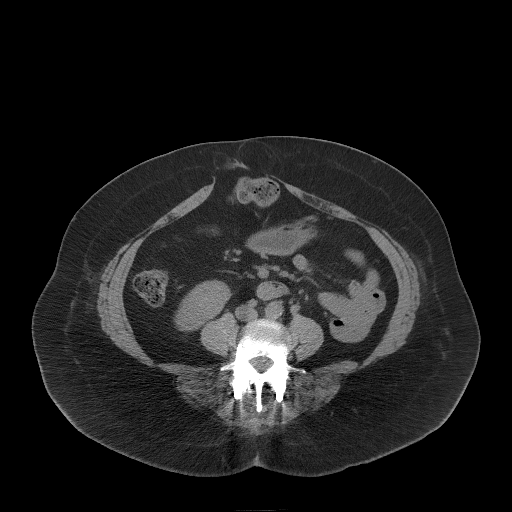
[im 56/96  soft-tissue]
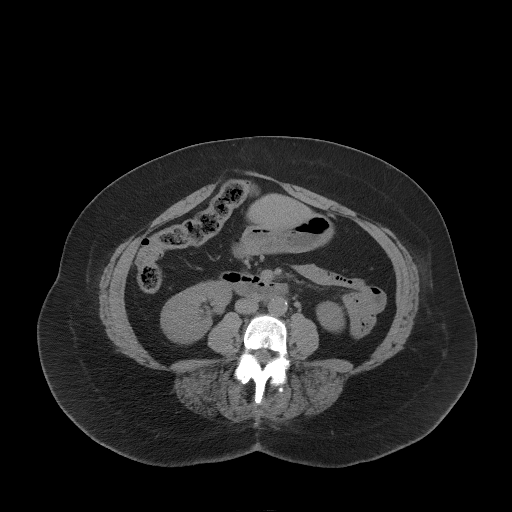
[im 56/96  bone]
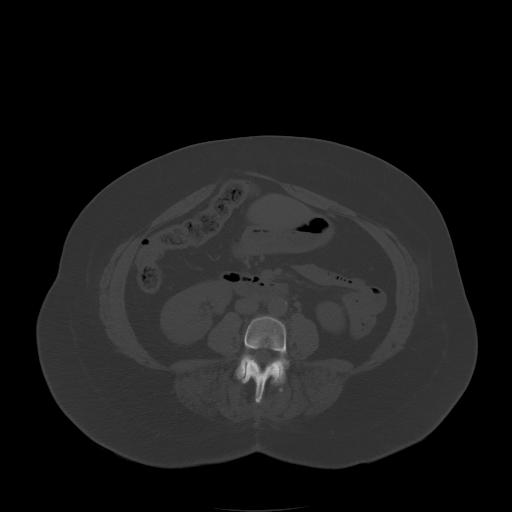
[im 64/96  soft-tissue]
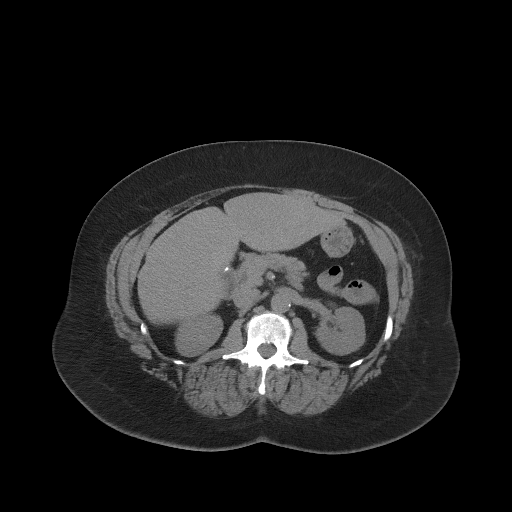
[im 72/96  soft-tissue]
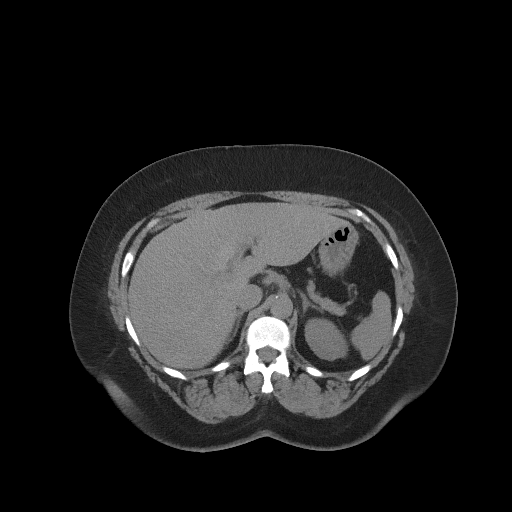
[im 76/96  soft-tissue]
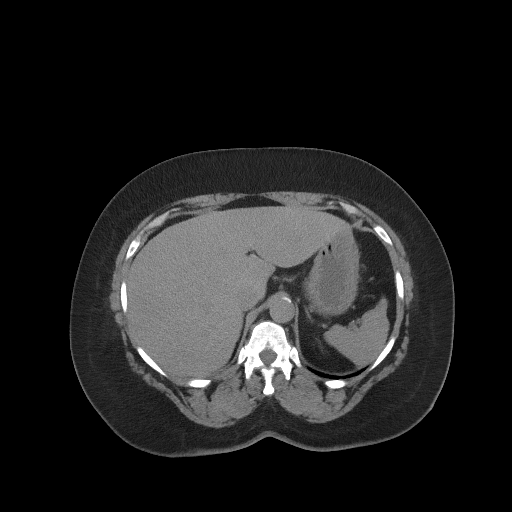
[im 84/96  soft-tissue]
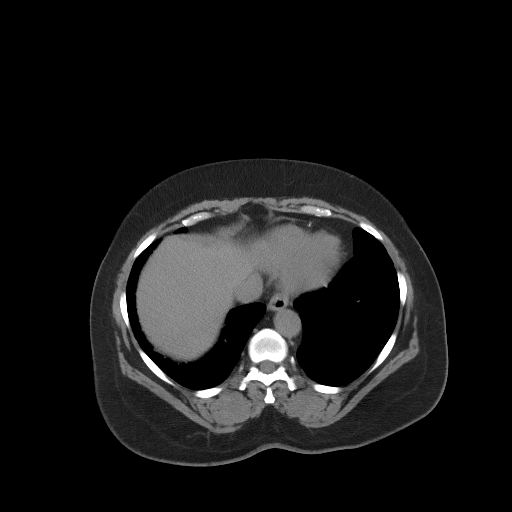
[im 92/96  soft-tissue]
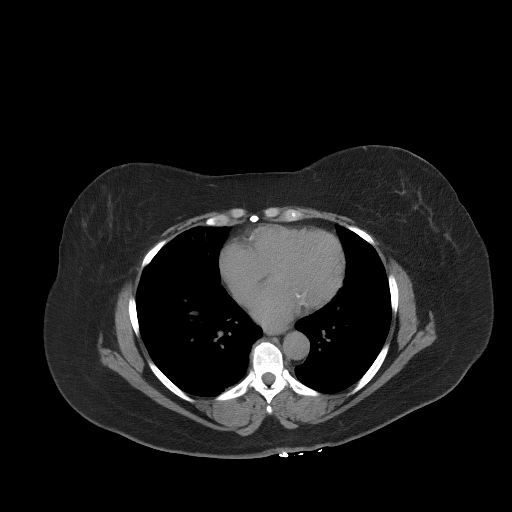

[Series 5: coronal st · coronal · 0.87mm/px · 3 of 105 slices shown]
[im 35/105  soft-tissue]
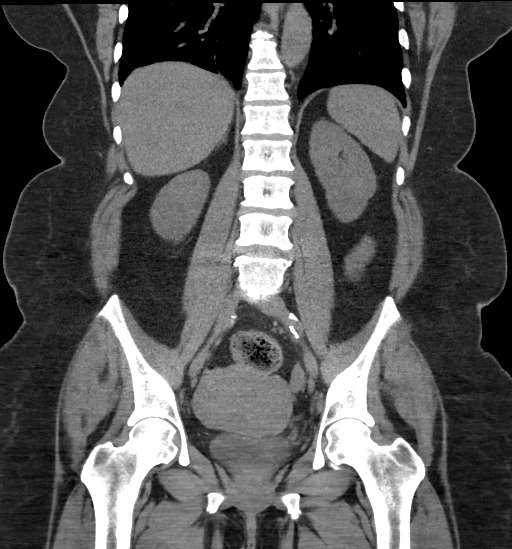
[im 47/105  soft-tissue]
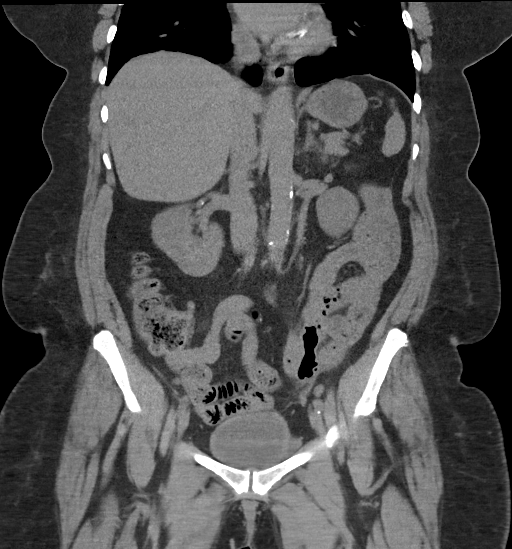
[im 58/105  soft-tissue]
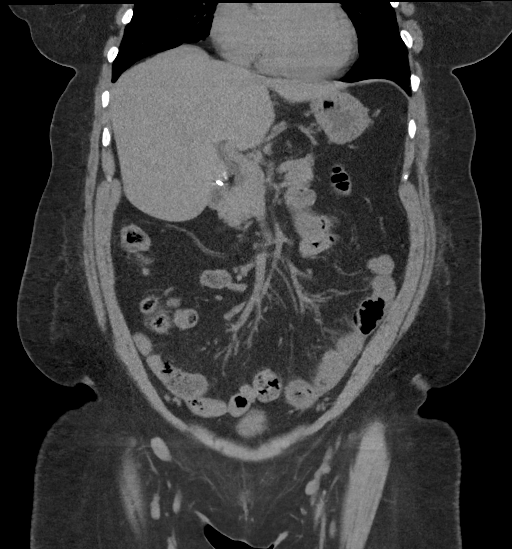

[17 of 46 positions shown; findings below may reference images not displayed]

FINDINGS: Lower chest: Coronary atherosclerosis. Mild atelectasis or scarring
at the right lung base.

Hepatobiliary: No focal liver finding.Cholecystectomy. No bile duct
dilatation.

Pancreas: Unremarkable.

Spleen: Unremarkable.

Adrenals/Urinary Tract: Negative adrenals. No hydronephrosis or
stone. Left-sided bladder diverticula without appreciable filling
defect

Stomach/Bowel: Suspect partial colectomy and prior colostomy. No
bowel obstruction. No appendicitis.

Vascular/Lymphatic: No acute vascular abnormality. Atheromatous
calcification of the aorta and iliacs. No mass or adenopathy.

Reproductive:Uterine fibroids largest towards the right where there
is a 5.5 cm mass with dystrophic calcification.

Other: No ascites or pneumoperitoneum. Incisional midline hernia
which is wide necked.

Musculoskeletal: No acute abnormalities. Lumbar spine degeneration
with solid L4-5 anterolisthesis
IMPRESSION: 1. No acute finding.  No bowel obstruction or visible inflammation.
2. Fatty midline incisional hernia with wide neck
3. Bladder diverticula.
4. Atherosclerosis, including the coronary arteries.

Aortic Atherosclerosis ([G7]-[G7]).

## 2020-12-01 MED ORDER — FENTANYL CITRATE PF 50 MCG/ML IJ SOSY
100.0000 ug | PREFILLED_SYRINGE | Freq: Once | INTRAMUSCULAR | Status: AC
  Administered 2020-12-01: 100 ug via INTRAMUSCULAR
  Filled 2020-12-01: qty 2

## 2020-12-01 MED ORDER — ONDANSETRON 4 MG PO TBDP
8.0000 mg | ORAL_TABLET | Freq: Once | ORAL | Status: AC
  Administered 2020-12-01: 8 mg via ORAL
  Filled 2020-12-01: qty 2

## 2020-12-01 MED ORDER — PROMETHAZINE HCL 25 MG RE SUPP
25.0000 mg | Freq: Four times a day (QID) | RECTAL | 0 refills | Status: AC | PRN
Start: 2020-12-01 — End: ?

## 2020-12-01 MED ORDER — PROMETHAZINE HCL 25 MG RE SUPP
25.0000 mg | Freq: Once | RECTAL | Status: DC | PRN
  Filled 2020-12-01: qty 1

## 2020-12-01 MED ORDER — PROMETHAZINE HCL 25 MG/ML IJ SOLN: 25.0000 mg | Freq: Once | INTRAMUSCULAR | Status: DC | PRN

## 2020-12-01 MED ORDER — ONDANSETRON HCL 4 MG/2ML IJ SOLN
4.0000 mg | Freq: Once | INTRAMUSCULAR | Status: DC
  Filled 2020-12-01: qty 2

## 2020-12-01 MED ORDER — FENTANYL CITRATE PF 50 MCG/ML IJ SOSY
100.0000 ug | PREFILLED_SYRINGE | Freq: Once | INTRAMUSCULAR | Status: DC
  Filled 2020-12-01: qty 2

## 2020-12-01 NOTE — ED Triage Notes (Signed)
Pt with hx of diverticulitis and abdominal surgery is here for abdominal pain and nausea and vomiting that began last pm.

## 2020-12-01 NOTE — ED Provider Notes (Signed)
[place] [facility]PT [facility] Provider Note: Georgena Spurling, MD, FACEP  CSN: [id number] MRN: [id number] ARRIVAL: 12/01/20 at [place]: [place]  Abdominal Pain   HISTORY OF PRESENT ILLNESS  12/01/20 1:49 AM Julia Medina is a 65 y.o. female with a remote history of bowel resection for diverticulosis that required a temporary colostomy.  She is here with abdominal pain that began about 3 hours ago.  The pain is located in the periumbilical region.  She describes the pain is sharp and rates it as between an 8 and a 10 out of 10.  It is worse with palpation or movement.  She has had nausea with this and has vomited 3 times.  She has moved her bowels but she is not having diarrhea.  She is not having a fever.  She did not take her insulin earlier due to the vomiting.  She believes she is thrown up her evening medications.  She was unable to take any nausea medication due to the vomiting.  She has chronic neuropathy of the lower legs.   Past Medical History:  Diagnosis Date   Aneurysm ([place])    CAD (coronary artery disease), native coronary artery    has stents   COPD (chronic obstructive pulmonary disease) (HCC)    Diabetes ([place])    Diverticulosis    Hypertension    Seizure ([place])     Past Surgical History:  Procedure Laterality Date   heart stents     intestine     diverticulosis   LUMBAR FUSION     TUMOR EXCISION     right ovary - benign    Family History  Problem Relation Age of Onset   Other Mother        unsure of health history   Other Father        unsure of health history   Seizures Neg Hx     Social History   Tobacco Use   Smoking status: Every Day    Packs/day: 1.00    Years: 45.00    Pack years: 45.00    Types: Cigarettes   Smokeless tobacco: Never  Substance Use Topics   Alcohol use: Yes    Comment: rare   Drug use: Never    Prior to Admission medications   Medication Sig Start Date End Date Taking? Authorizing Provider   promethazine (PHENERGAN) 25 MG suppository Place 1 suppository (25 mg total) rectally every 6 (six) hours as needed for nausea or vomiting. 12/01/20  Yes Jayjay Littles, MD  acetaminophen (TYLENOL) 650 MG CR tablet Take 650-1,300 mg by mouth every 8 (eight) hours as needed for pain.    [provider]  albuterol (PROVENTIL HFA;VENTOLIN HFA) 108 (90 Base) MCG/ACT inhaler Inhale 2 puffs into the lungs every 4 (four) hours as needed for wheezing or shortness of breath. 12/02/16   Lawyer, Harrell Gave, PA-C  blood glucose meter kit and supplies Dispense based on patient and insurance preference. Check blood sugar once daily. (FOR ICD-10 E10.9, E11.9). 12/03/19   Danford, Suann Larry, MD  clonazePAM (KLONOPIN) 0.5 MG tablet Take 1 tablet (0.5 mg total) by mouth 2 (two) times daily as needed for anxiety. 02/21/20   Marcial Pacas, MD  clopidogrel (PLAVIX) 75 MG tablet Take 1 tablet (75 mg total) by mouth daily. 12/03/19   Danford, Suann Larry, MD  DULoxetine (CYMBALTA) 60 MG capsule Take 1 capsule (60 mg total) by mouth daily. Please call [phone] to schedule appt for further refill  or may request from PCP. 09/19/20   Marcial Pacas, MD  furosemide (LASIX) 20 MG tablet Take 1 tablet (20 mg total) by mouth daily. 12/03/19   Danford, Suann Larry, MD  gabapentin (NEURONTIN) 300 MG capsule Take 1 capsule (300 mg total) by mouth 3 (three) times daily. 02/21/20   Marcial Pacas, MD  hydrochlorothiazide (HYDRODIURIL) 25 MG tablet Take 1 tablet (25 mg total) by mouth daily. 12/03/19   Danford, Suann Larry, MD  insulin NPH Human (NOVOLIN N RELION) 100 UNIT/ML injection Inject 0.12 mLs (12 Units total) into the skin 2 (two) times daily before a meal. 12/03/19   Danford, Suann Larry, MD  Insulin Syringes, Disposable, U-100 0.5 ML MISC Use to inject insulin twice daily 12/03/19   Danford, Suann Larry, MD  levETIRAcetam (KEPPRA) 500 MG tablet Take 1 tablet (500 mg total) by mouth 2 (two) times daily. 02/21/20   Marcial Pacas,  MD  metFORMIN (GLUCOPHAGE) 500 MG tablet Take 1 tablet (500 mg total) by mouth daily with breakfast. 12/04/19   Danford, Suann Larry, MD  metoprolol tartrate (LOPRESSOR) 50 MG tablet Take 1 tablet (50 mg total) by mouth 2 (two) times daily. 12/03/19   Danford, Suann Larry, MD  omeprazole (PRILOSEC) 20 MG capsule Take 20 mg by mouth daily.    [provider]  simvastatin (ZOCOR) 40 MG tablet Take 1 tablet (40 mg total) by mouth at bedtime. 12/03/19   Danford, Suann Larry, MD    Allergies Aspirin, Iodine, Amoxicillin, Penicillins, and Sulfa antibiotics   REVIEW OF SYSTEMS  Negative except as noted here or in the History of Present Illness.   PHYSICAL EXAMINATION  Initial Vital Signs Blood pressure (!) 160/86, pulse 81, temperature 98.2 F (36.8 C), temperature source Oral, resp. rate 16, weight 98.9 kg, SpO2 94 %.  Examination General: Well-developed, well-nourished female in no acute distress; appearance consistent with age of record HENT: normocephalic; atraumatic; coated tongue Eyes: pupils equal, round and reactive to light; extraocular muscles intact Neck: supple Heart: regular rate and rhythms Lungs: clear to auscultation bilaterally Abdomen: soft; nondistended; periumbilical tenderness; bowel sounds hypoactive; old, well-healed surgical scars Extremities: No deformity; full range of motion; DP pulses +1; trace edema of right foot Neurologic: Awake, alert and oriented; motor function intact in all extremities and symmetric; no facial droop Skin: Warm and dry; dry scaly skin, notably of lower extremities Psychiatric: Normal mood and affect   RESULTS  Summary of this visit's results, reviewed and interpreted by myself:   EKG Interpretation  Date/Time:    Ventricular Rate:    PR Interval:    QRS Duration:   QT Interval:    QTC Calculation:   R Axis:     Text Interpretation:         Laboratory Studies: Results for orders placed or performed during the  hospital encounter of 12/01/20 (from the past 24 hour(s))  Lipase, blood     Status: None   Collection Time: 12/01/20  3:35 AM  Result Value Ref Range   Lipase 33 11 - 51 U/L  Comprehensive metabolic panel     Status: Abnormal   Collection Time: 12/01/20  3:35 AM  Result Value Ref Range   Sodium 135 135 - 145 mmol/L   Potassium 4.1 3.5 - 5.1 mmol/L   Chloride 101 98 - 111 mmol/L   CO2 26 22 - 32 mmol/L   Glucose, Bld 160 (H) 70 - 99 mg/dL   BUN 17 8 - 23 mg/dL   Creatinine,  Ser 1.10 (H) 0.44 - 1.00 mg/dL   Calcium 9.5 8.9 - 10.3 mg/dL   Total Protein 7.5 6.5 - 8.1 g/dL   Albumin 3.7 3.5 - 5.0 g/dL   AST 21 15 - 41 U/L   ALT 16 0 - 44 U/L   Alkaline Phosphatase 101 38 - 126 U/L   Total Bilirubin 0.5 0.3 - 1.2 mg/dL   GFR, Estimated 56 (L) >60 mL/min   Anion gap 8 5 - 15  CBC with Differential/Platelet     Status: Abnormal   Collection Time: 12/01/20  3:35 AM  Result Value Ref Range   WBC 13.7 (H) 4.0 - 10.5 K/uL   RBC 4.97 3.87 - 5.11 MIL/uL   Hemoglobin 15.3 (H) 12.0 - 15.0 g/dL   HCT 44.2 36.0 - 46.0 %   MCV 88.9 80.0 - 100.0 fL   MCH 30.8 26.0 - 34.0 pg   MCHC 34.6 30.0 - 36.0 g/dL   RDW 14.6 11.5 - 15.5 %   Platelets 346 150 - 400 K/uL   nRBC 0.0 0.0 - 0.2 %   Neutrophils Relative % 73 %   Neutro Abs 10.1 (H) 1.7 - 7.7 K/uL   Lymphocytes Relative 19 %   Lymphs Abs 2.6 0.7 - 4.0 K/uL   Monocytes Relative 5 %   Monocytes Absolute 0.7 0.1 - 1.0 K/uL   Eosinophils Relative 1 %   Eosinophils Absolute 0.2 0.0 - 0.5 K/uL   Basophils Relative 1 %   Basophils Absolute 0.1 0.0 - 0.1 K/uL   Immature Granulocytes 1 %   Abs Immature Granulocytes 0.08 (H) 0.00 - 0.07 K/uL   Imaging Studies: CT ABDOMEN PELVIS WO CONTRAST  Result Date: 12/01/2020 CLINICAL DATA:  Acute, nonlocalized abdominal pain EXAM: CT ABDOMEN AND PELVIS WITHOUT CONTRAST TECHNIQUE: Multidetector CT imaging of the abdomen and pelvis was performed following the standard protocol without IV contrast.  COMPARISON:  08/12/2014 FINDINGS: Lower chest: Coronary atherosclerosis. Mild atelectasis or scarring at the right lung base. Hepatobiliary: No focal liver finding.Cholecystectomy. No bile duct dilatation. Pancreas: Unremarkable. Spleen: Unremarkable. Adrenals/Urinary Tract: Negative adrenals. No hydronephrosis or stone. Left-sided bladder diverticula without appreciable filling defect Stomach/Bowel: Suspect partial colectomy and prior colostomy. No bowel obstruction. No appendicitis. Vascular/Lymphatic: No acute vascular abnormality. Atheromatous calcification of the aorta and iliacs. No mass or adenopathy. Reproductive:Uterine fibroids largest towards the right where there is a 5.5 cm mass with dystrophic calcification. Other: No ascites or pneumoperitoneum. Incisional midline hernia which is wide necked. Musculoskeletal: No acute abnormalities. Lumbar spine degeneration with solid L4-5 anterolisthesis IMPRESSION: 1. No acute finding.  No bowel obstruction or visible inflammation. 2. Fatty midline incisional hernia with wide neck 3. Bladder diverticula. 4. Atherosclerosis, including the coronary arteries. Aortic Atherosclerosis (ICD10-I70.0). Electronically Signed   By: Jorje Guild M.D.   On: 12/01/2020 04:49    ED COURSE and MDM  Nursing notes, initial and subsequent vitals signs, including pulse oximetry, reviewed and interpreted by myself.  Vitals:   12/01/20 0143 12/01/20 0146  BP: (!) 160/86   Pulse: 81   Resp: 16   Temp: 98.2 F (36.8 C)   TempSrc: Oral   SpO2: 94%   Weight:  98.9 kg   Medications  promethazine (PHENERGAN) injection 25 mg (has no administration in time range)  ondansetron (ZOFRAN-ODT) disintegrating tablet 8 mg (8 mg Oral Given 12/01/20 0322)  fentaNYL (SUBLIMAZE) injection 100 mcg (100 mcg Intramuscular Given 12/01/20 0322)   5:02 AM Patient feeling better after fentanyl and Zofran.  We were unable to get an IV for administration of fluids but she has had no vomiting  in the ED.  She is feeling better.  I suspect her symptoms were due to an acute viral process as her CT scan is unremarkable.  She does have a leukocytosis but she states she always has a leukocytosis without unknown etiology.   PROCEDURES  Procedures  VENIPUNCTURE After verbal informed consent was obtained the patient's left groin was prepped with chlorhexidine.  An 18-gauge needle was inserted into the left femoral vein using ultrasound guidance.  20 mL of venous blood were obtained and the syringes were handed to the nurse who transferred the blood to the appropriately labeled phlebotomy tubes.  Pressure was held in place to prevent hematoma.  The patient tolerated this well and there were no immediate complications.  ED DIAGNOSES     ICD-10-CM   1. Periumbilical abdominal pain  R10.33     2. Nausea and vomiting in adult  R11.2          Shanon Rosser, MD 12/01/20 [phone]

## 2020-12-01 NOTE — ED Notes (Signed)
Pt gone to CT 

## 2021-02-10 ENCOUNTER — Other Ambulatory Visit: Payer: Self-pay | Admitting: Neurology

## 2021-03-13 ENCOUNTER — Other Ambulatory Visit: Payer: Self-pay | Admitting: Neurology

## 2021-03-13 NOTE — Telephone Encounter (Signed)
Rx refilled.

## 2021-07-22 ENCOUNTER — Other Ambulatory Visit: Payer: Self-pay | Admitting: Neurology

## 2021-10-06 ENCOUNTER — Other Ambulatory Visit: Payer: Self-pay | Admitting: Neurology
